# Patient Record
Sex: Male | Born: 1990 | Race: White | Hispanic: No | Marital: Married | Smoking: Never smoker
Health system: Southern US, Community
[De-identification: ages and names within clinical notes are randomized; demographics above are authoritative.]

## PROBLEM LIST (undated history)

## (undated) DIAGNOSIS — J45909 Unspecified asthma, uncomplicated: Secondary | ICD-10-CM

## (undated) HISTORY — PX: DENTAL SURGERY: SHX609

---

## 2012-06-21 ENCOUNTER — Emergency Department (INDEPENDENT_AMBULATORY_CARE_PROVIDER_SITE_OTHER): Payer: BC Managed Care – PPO

## 2012-06-21 ENCOUNTER — Emergency Department (INDEPENDENT_AMBULATORY_CARE_PROVIDER_SITE_OTHER)
Admission: EM | Admit: 2012-06-21 | Discharge: 2012-06-21 | Disposition: A | Discharge status: Home / Self Care | Payer: BC Managed Care – PPO | Source: Home / Self Care | Attending: Family Medicine | Admitting: Family Medicine

## 2012-06-21 ENCOUNTER — Encounter: Payer: Self-pay | Admitting: Emergency Medicine

## 2012-06-21 DIAGNOSIS — R05 Cough: Secondary | ICD-10-CM

## 2012-06-21 DIAGNOSIS — J101 Influenza due to other identified influenza virus with other respiratory manifestations: Secondary | ICD-10-CM

## 2012-06-21 DIAGNOSIS — R059 Cough, unspecified: Secondary | ICD-10-CM

## 2012-06-21 DIAGNOSIS — J111 Influenza due to unidentified influenza virus with other respiratory manifestations: Secondary | ICD-10-CM

## 2012-06-21 HISTORY — DX: Unspecified asthma, uncomplicated: J45.909

## 2012-06-21 MED ORDER — AZITHROMYCIN 250 MG PO TABS: ORAL_TABLET | ORAL | Status: DC

## 2012-06-21 MED ORDER — GUAIFENESIN-CODEINE 100-10 MG/5ML PO SYRP: 10.0000 mL | ORAL_SOLUTION | Freq: Every day | ORAL | Status: DC

## 2012-06-21 MED ORDER — PREDNISONE 20 MG PO TABS: 20.0000 mg | ORAL_TABLET | Freq: Two times a day (BID) | ORAL | Status: DC

## 2012-06-21 NOTE — ED Provider Notes (Signed)
History     CSN: [id number]  Arrival date & time 06/21/12  1556   First MD Initiated Contact with Patient 06/21/12 1618      Chief Complaint  Patient presents with  . Fever  . Nasal Congestion  . Cough  . Generalized Body Aches  . Dizziness     HPI Comments: Patient developed cough and low grade fever about 6 days ago, and two days later developed increasing fever, myalgias, and fatigue.  His symptoms waxed and waned, and two days ago he presented to [facility] where a flu test was positive for influenza A.  He continues to feel fatigued with recurring shortness of breath with activity but no wheezes.  He has a history of environmentally induced asthma.  He does not use an albuterol inhaler.  He does not smoke.  The history is provided by the patient and a friend.    Past Medical History  Diagnosis Date  . Asthma     History reviewed. No pertinent past surgical history.  Family History  Problem Relation Age of Onset  . Hypertension Maternal Uncle     History  Substance Use Topics  . Smoking status: Never Smoker   . Smokeless tobacco: Not on file  . Alcohol Use: No      Review of Systems No sore throat + cough No pleuritic pain but feels tight in anterior chest ? wheezing No nasal congestion No post-nasal drainage No sinus pain/pressure No itchy/red eyes No earache No hemoptysis + SOB + fever, + chills No nausea No vomiting No abdominal pain No diarrhea No urinary symptoms No skin rashes + fatigue + myalgias + headache Used OTC meds without relief  Allergies  Peanuts  Home Medications   Current Outpatient Rx  Name  Route  Sig  Dispense  Refill  . AZITHROMYCIN 250 MG PO TABS      Take 2 tabs today; then begin one tab once daily for 4 more days. (Rx void after 06/29/12)   6 each   0   . GUAIFENESIN-CODEINE 100-10 MG/5ML PO SYRP   Oral   Take 10 mLs by mouth at bedtime. for cough   120 mL   0   . PREDNISONE 20 MG PO TABS   Oral   Take  1 tablet (20 mg total) by mouth 2 (two) times daily.   10 tablet   0     BP 122/78  Pulse 102  Temp 98.8 F (37.1 C) (Oral)  Resp 16  Ht 5\' 6"  (1.676 m)  Wt 165 lb (74.844 kg)  BMI 26.63 kg/m2  SpO2 97%  Physical Exam Nursing notes and Vital Signs reviewed. Appearance:  Patient appears healthy, stated age, and in no acute distress Eyes:  Pupils are equal, round, and reactive to light and accomodation.  Extraocular movement is intact.  Conjunctivae are not inflamed  Ears:  Canals normal.  Tympanic membranes normal.  Nose:  Mildly congested turbinates.  No sinus tenderness  Pharynx:  Normal Neck:  Supple.  No adenopathy Lungs:  Clear to auscultation.  Breath sounds are equal.  Heart:  Regular rate and rhythm without murmurs, rubs, or gallops.  Abdomen:  Nontender without masses or hepatosplenomegaly.  Bowel sounds are present.  No CVA or flank tenderness.  Extremities:  No edema.  No calf tenderness Skin:  No rash present.   ED Course  Procedures  none  Labs Reviewed - No data to display Dg Chest 2 View  06/21/2012  *  RADIOLOGY REPORT*  Clinical Data: 6 days of coughing.  CHEST - 2 VIEW  Comparison: None.  Findings: Two views of the chest demonstrate clear lung.   Heart and mediastinum are within normal limits.  The trachea is midline. Bony thorax is intact.  IMPRESSION: No acute cardiopulmonary disease.   Original Report Authenticated By: Richarda Overlie, M.D.      1. Influenza A   2. Persistent cough       MDM  Begin prednisone burst.  Tussi-Organidin at bedtime for cough. Take plain Mucinex (guaifenesin) twice daily for cough and congestion.  Increase fluid intake, rest. Stop all antihistamines for now, and other non-prescription cough/cold preparations. Begin Azithromycin if not improving about 5 days or if persistent fever develops. Recommend a flu shot when well.  Follow-up with family doctor if not improving about one week.        Lattie Haw, MD 06/25/12  [phone]

## 2012-06-21 NOTE — ED Notes (Signed)
Reports 6 days of congestion, cough, intermittent fever, body and headache. No Flu Vaccination this season. No recent OTCs.

## 2012-11-20 ENCOUNTER — Encounter: Payer: Self-pay | Admitting: Family Medicine

## 2012-11-20 ENCOUNTER — Ambulatory Visit (INDEPENDENT_AMBULATORY_CARE_PROVIDER_SITE_OTHER): Payer: BC Managed Care – PPO | Admitting: Family Medicine

## 2012-11-20 VITALS — BP 146/80 | HR 61 | Ht 67.0 in | Wt 162.0 lb

## 2012-11-20 DIAGNOSIS — R1013 Epigastric pain: Secondary | ICD-10-CM | POA: Insufficient documentation

## 2012-11-20 LAB — COMPLETE METABOLIC PANEL WITH GFR
ALT: 30 U/L (ref 0–53)
AST: 25 U/L (ref 0–37)
Albumin: 4.8 g/dL (ref 3.5–5.2)
Alkaline Phosphatase: 50 U/L (ref 39–117)
Glucose, Bld: 101 mg/dL — ABNORMAL HIGH (ref 70–99)
Potassium: 4.2 mEq/L (ref 3.5–5.3)
Sodium: 139 mEq/L (ref 135–145)
Total Protein: 7.4 g/dL (ref 6.0–8.3)

## 2012-11-20 MED ORDER — OMEPRAZOLE 40 MG PO CPDR: 40.0000 mg | DELAYED_RELEASE_CAPSULE | Freq: Every day | ORAL | Status: DC

## 2012-11-20 NOTE — Progress Notes (Signed)
CC: Ross Wagner is a 22 y.o. male is here for Establish Care and digestive issues?   Subjective: HPI:  Patient complains of epigastric pain    that is described as a spasm and cramping any sensation of an empty stomach that is of moderate severity that seems to be worse with raw bananas, lettuce, raw vegetables, but can occur anytime of the day he regardless of dietary habits. It is somewhat radiating into the right upper quadrant sometimes into the back to a mild degree. Significantly worsened the last 2 days has been present for about a month overall.  Symptoms significantly improved with Aloe vera liquid concoction.  He reports rare nonsteroidal anti-inflammatory use. He denies alcohol use or tobacco use. No recreational drug use. He denies nausea, vomiting, decreased appetite, constipation nor diarrhea. Frequent has sensation of solids and liquids slowly passing down the esophagus when this pain above is present  Review of Systems - General ROS: negative for - chills, fever, night sweats, weight gain or weight loss Ophthalmic ROS: negative for - decreased vision Psychological ROS: negative for - anxiety or depression ENT ROS: negative for - hearing change, nasal congestion, tinnitus or allergies Hematological and Lymphatic ROS: negative for - bleeding problems, bruising or swollen lymph nodes Breast ROS: negative Respiratory ROS: no cough, shortness of breath, or wheezing Cardiovascular ROS: no chest pain or dyspnea on exertion Gastrointestinal ROS: no change in bowel habits, or black or bloody stools Genito-Urinary ROS: negative for - genital discharge, genital ulcers, incontinence or abnormal bleeding from genitals Musculoskeletal ROS: negative for - joint pain or muscle pain Neurological ROS: negative for - headaches or memory loss Dermatological ROS: negative for lumps, mole changes, rash and skin lesion changes  Past Medical History  Diagnosis Date  . Asthma      Family  History  Problem Relation Age of Onset  . Hypertension Maternal Uncle   . Stroke Maternal Grandmother      History  Substance Use Topics  . Smoking status: Never Smoker   . Smokeless tobacco: Not on file  . Alcohol Use: No     Objective: Filed Vitals:   11/20/12 1037  BP: 146/80  Pulse: 61    General: Alert and Oriented, No Acute Distress HEENT: Pupils equal, round, reactive to light. Conjunctivae clear.  Moist mucous membranes pharynx unremarkable Lungs: Clear to auscultation bilaterally, no wheezing/ronchi/rales.  Comfortable work of breathing. Good air movement. Cardiac: Regular rate and rhythm. Normal S1/S2.  No murmurs, rubs, nor gallops.   Abdomen: Normal bowel sounds, soft and non tender without palpable masses. Extremities: No peripheral edema.  Strong peripheral pulses.  Mental Status: No depression, anxiety, nor agitation. Skin: Warm and dry.  Assessment & Plan: Ross Wagner was seen today for establish care and digestive issues?.  Diagnoses and associated orders for this visit:  Abdominal pain, epigastric - omeprazole (PRILOSEC) 40 MG capsule; Take 1 capsule (40 mg total) by mouth daily. - COMPLETE METABOLIC PANEL WITH GFR - H. pylori antibody, IgG    Discussed with patient my suspicion for gastritis versus cholecystitis, low suspicion for pancreatitis. Start proton pump inhibitor, given samples of Nexium for 10 days if improved start omeprazole. Labs above  Return in about 4 weeks (around 12/18/2012).

## 2013-04-26 ENCOUNTER — Ambulatory Visit (INDEPENDENT_AMBULATORY_CARE_PROVIDER_SITE_OTHER): Payer: BC Managed Care – PPO | Admitting: Family Medicine

## 2013-04-26 ENCOUNTER — Encounter: Payer: Self-pay | Admitting: Family Medicine

## 2013-04-26 VITALS — BP 136/86 | HR 69 | Wt 160.0 lb

## 2013-04-26 DIAGNOSIS — K644 Residual hemorrhoidal skin tags: Secondary | ICD-10-CM

## 2013-04-26 MED ORDER — HYDROCORTISONE 2.5 % RE CREA: TOPICAL_CREAM | RECTAL | Status: AC

## 2013-04-26 NOTE — Progress Notes (Signed)
CC: Ross Wagner is a 22 y.o. male is here for Hemorrhoids   Subjective: HPI:  Rectal discomfort that has been present since Thursday initially described as 8 irritation and swelling worse when sitting down slightly improved since starting Preparation H twice a day over the weekend. He's never had this before. 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  He denies recent constipation, blood in stool, melena, abdominal pain, painful defecation.   Review Of Systems Outlined In HPI  Past Medical History  Diagnosis Date  . Asthma      Family History  Problem Relation Age of Onset  . Hypertension Maternal Uncle   . Stroke Maternal Grandmother      History  Substance Use Topics  . Smoking status: Never Smoker   . Smokeless tobacco: Not on file  . Alcohol Use: No     Objective: Filed Vitals:   04/26/13 1458  BP: 136/86  Pulse: 69    Vital signs reviewed. General: Alert and Oriented, No Acute Distress HEENT: Pupils equal, round, reactive to light. Conjunctivae clear.  External ears unremarkable.  Moist mucous membranes. Lungs: Clear and comfortable work of breathing, speaking in full sentences without accessory muscle use. Cardiac: Regular rate and rhythm.  Rectal: Single slightly inflamed external hemorrhoid easily reducible, approximately 1 cm diameter Mental Status: No depression, anxiety, nor agitation. Logical though process. Skin: Warm and dry.  Assessment & Plan: [name] was seen today for hemorrhoids.  Diagnoses and associated orders for this visit:  External hemorrhoid - hydrocortisone (ANUSOL-HC) 2.5 % rectal cream; Apply rectally 2 times daily    External hemorrhoid: Continue Preparation H may use hydrocortisone for any irritation or discomfort, focus on easy to pass painless bowel movements daily. Discussed signs and symptoms of thrombosed hemorrhoid that would require surgical intervention. Anticipate continued self  resolution in one week.  Return if symptoms worsen or fail to improve.

## 2013-06-30 ENCOUNTER — Ambulatory Visit (INDEPENDENT_AMBULATORY_CARE_PROVIDER_SITE_OTHER): Payer: BC Managed Care – PPO | Admitting: Family Medicine

## 2013-06-30 ENCOUNTER — Encounter: Payer: Self-pay | Admitting: Family Medicine

## 2013-06-30 VITALS — BP 145/80 | HR 73 | Temp 98.1°F | Wt 161.0 lb

## 2013-06-30 DIAGNOSIS — R3915 Urgency of urination: Secondary | ICD-10-CM

## 2013-06-30 LAB — POCT URINALYSIS DIPSTICK
Bilirubin, UA: NEGATIVE
Glucose, UA: NEGATIVE
Ketones, UA: NEGATIVE
LEUKOCYTES UA: NEGATIVE
NITRITE UA: NEGATIVE
PH UA: 6.5
PROTEIN UA: NEGATIVE
Spec Grav, UA: 1.01
UROBILINOGEN UA: 0.2

## 2013-06-30 MED ORDER — CIPROFLOXACIN HCL 500 MG PO TABS: 500.0000 mg | ORAL_TABLET | Freq: Two times a day (BID) | ORAL | Status: DC

## 2013-06-30 NOTE — Progress Notes (Signed)
CC: Ross Wagner is a 23 y.o. male is here for urinary urgency?   Subjective: HPI:  Complains of urinary urgency and frequency of mild-to-moderate severity it has been present since Monday. Occur all hours of the day. Nothing particularly makes better or worse. Over the past 12 hours has been accompanied by a mild dull sensation in the left testicle but comes and goes throughout the day. No interventions as of yet. He had questionable flank pain on the right for matter of hours on Monday but this is not past. He denies change in the odor color or consistency of his urine. Denies fevers, chills, penile discharge. Has been in a monogamous relationship for over 2 years no new sexual partners   Review Of Systems Outlined In HPI  Past Medical History  Diagnosis Date  . Asthma      Family History  Problem Relation Age of Onset  . Hypertension Maternal Uncle   . Stroke Maternal Grandmother      History  Substance Use Topics  . Smoking status: Never Smoker   . Smokeless tobacco: Not on file  . Alcohol Use: No     Objective: Filed Vitals:   06/30/13 1545  BP: 145/80  Pulse: 73  Temp: 98.1 F (36.7 C)    Vital signs reviewed. General: Alert and Oriented, No Acute Distress HEENT: Pupils equal, round, reactive to light. Conjunctivae clear.  External ears unremarkable.  Moist mucous membranes. Lungs: Clear and comfortable work of breathing, speaking in full sentences without accessory muscle use. Cardiac: Regular rate and rhythm.  N back: No CVA tenderness Extremities: No peripheral edema.  Strong peripheral pulses.  Mental Status: No depression, anxiety, nor agitation. Logical though process. Skin: Warm and dry.  Assessment & Plan: Ross Wagner was seen today for urinary urgency?.  Diagnoses and associated orders for this visit:  Urinary urgency - Urinalysis Dipstick - Urine Culture - GC/chlamydia probe amp, urine - ciprofloxacin (CIPRO) 500 MG tablet; Take 1 tablet (500  mg total) by mouth 2 (two) times daily.    Urinary urgency: No glucose in urine however blood is present, low suspicion for kidney stones next is suspicious for urinary tract infection, doubt for gonorrhea and Chlamydia however will ensure he is not harboring STI, start Cipro and we will follow culture  Return if symptoms worsen or fail to improve.

## 2013-07-01 ENCOUNTER — Telehealth: Payer: Self-pay | Admitting: *Deleted

## 2013-07-01 LAB — GC/CHLAMYDIA PROBE AMP, URINE
Chlamydia, Swab/Urine, PCR: NEGATIVE
GC PROBE AMP, URINE: NEGATIVE

## 2013-07-01 NOTE — Telephone Encounter (Signed)
Pt calls and states wife had a urine sample done last week at [facility] and was positive for Group B strep bacteria. He states he seen you yesterday and wanted to let you know this. Barry DienesKimberly Gordon, LPN

## 2013-07-02 ENCOUNTER — Telehealth: Payer: Self-pay | Admitting: *Deleted

## 2013-07-02 LAB — URINE CULTURE: Colony Count: 9000

## 2013-07-02 MED ORDER — SULFAMETHOXAZOLE-TRIMETHOPRIM 800-160 MG PO TABS: ORAL_TABLET | ORAL | Status: DC

## 2013-07-02 NOTE — Telephone Encounter (Signed)
Sue Lushndrea, Will you please let [name] know that if his symptoms are not resolved by tomorrow then I'd encourage him to switch antiboitics to bactrim which I've already sent to [facility] since it'll be the weekend.

## 2013-07-02 NOTE — Telephone Encounter (Signed)
Pt calls triage line back and states that his symptoms cone and go in waves.  Yesterday he felt ok but today he is having some urgency, nausea, discomfort testicle area and some low back pain

## 2013-07-02 NOTE — Telephone Encounter (Signed)
Pt notified. I told him if he didn't notice any improvement after starting the abx over the weekend then to call us back on mon.Pt voiced understanding

## 2013-07-05 ENCOUNTER — Ambulatory Visit (HOSPITAL_BASED_OUTPATIENT_CLINIC_OR_DEPARTMENT_OTHER)
Admission: RE | Admit: 2013-07-05 | Discharge: 2013-07-05 | Disposition: A | Discharge status: Home / Self Care | Payer: BC Managed Care – PPO | Source: Ambulatory Visit | Attending: Family Medicine | Admitting: Family Medicine

## 2013-07-05 ENCOUNTER — Other Ambulatory Visit: Payer: Self-pay | Admitting: Family Medicine

## 2013-07-05 ENCOUNTER — Ambulatory Visit (INDEPENDENT_AMBULATORY_CARE_PROVIDER_SITE_OTHER): Payer: BC Managed Care – PPO | Admitting: Family Medicine

## 2013-07-05 ENCOUNTER — Encounter: Payer: Self-pay | Admitting: Family Medicine

## 2013-07-05 ENCOUNTER — Telehealth: Payer: Self-pay | Admitting: Family Medicine

## 2013-07-05 VITALS — BP 146/84 | HR 74 | Temp 97.8°F | Wt 157.0 lb

## 2013-07-05 DIAGNOSIS — N509 Disorder of male genital organs, unspecified: Secondary | ICD-10-CM

## 2013-07-05 DIAGNOSIS — N50812 Left testicular pain: Secondary | ICD-10-CM

## 2013-07-05 DIAGNOSIS — N200 Calculus of kidney: Secondary | ICD-10-CM

## 2013-07-05 LAB — POCT URINALYSIS DIPSTICK
BILIRUBIN UA: NEGATIVE
GLUCOSE UA: NEGATIVE
KETONES UA: NEGATIVE
Leukocytes, UA: NEGATIVE
NITRITE UA: NEGATIVE
Protein, UA: NEGATIVE
SPEC GRAV UA: 1.01
Urobilinogen, UA: 0.2
pH, UA: 6

## 2013-07-05 MED ORDER — TAMSULOSIN HCL 0.4 MG PO CAPS: 0.4000 mg | ORAL_CAPSULE | Freq: Every day | ORAL | Status: DC

## 2013-07-05 NOTE — Progress Notes (Signed)
CC: Ross Wagner is a [age] y.o. male is here for side effects from medication   Subjective: HPI:  Complains of continued left groin pain that radiates into the left testicle it comes in waves mild in severity at its maximum. It is worse when lying on his side with his legs together, symptoms are improved with opening his legs. Symptoms are also worse with sitting for long periods of time. Denies dysuria but still endorses mild urinary frequency. Symptoms have not gotten better or worse for the most part since being on Cipro and then later changing to Bactrim. Denies dysuria or penile discharge. Denies testicular swelling or overlying skin changes. This is been accompanied by nausea that is unchanging overall since earlier this week. Reports feeling waves of fatigue and mild shortness of breath ever since starting Bactrim on Saturday he has stopped taking this medication as of yesterday morning.  Denies fevers, chills, night sweats, abdominal pain, vomiting, nor flank pain.  Review Of Systems Outlined In HPI  Past Medical History  Diagnosis Date  . Asthma      Family History  Problem Relation Age of Onset  . Hypertension Maternal Uncle   . Stroke Maternal Grandmother      History  Substance Use Topics  . Smoking status: Never Smoker   . Smokeless tobacco: Not on file  . Alcohol Use: No     Objective: Filed Vitals:   07/05/13 1306  BP: 146/84  Pulse: 74  Temp: 97.8 F (36.6 C)    General: Alert and Oriented, No Acute Distress HEENT: Pupils equal, round, reactive to light. Conjunctivae clear.  Moist mucous membranes Lungs: Clear to auscultation bilaterally, no wheezing/ronchi/rales.  Comfortable work of breathing. Good air movement. Cardiac: Regular rate and rhythm. Normal S1/S2.  No murmurs, rubs, nor gallops.   Genitourinary: Unremarkable penis without lesions or discharge. Left testicle is slightly tender at the epididymis there is no swelling or palpable masses in the  testicle. Mental Status: No depression, anxiety, nor agitation. Skin: Warm and dry.  Assessment & Plan: [name] was seen today for side effects from medication.  Diagnoses and associated orders for this visit:  Testicular pain, left - Urinalysis Dipstick - Urine Culture - US Scrotum; Future    Left testicular pain: Blood remains in his urinalysis, since I can reproduce the pain when inspecting the testicle nephrolithiasis is lower on the differential. Will check ultrasound of the scrotum due to suspicion of epididymitis.  Fortunately gonorrhea and Chlamydia have been negative and urine culture failed to speciate last week. We will repeat urine culture. Stop Bactrim for now  Return if symptoms worsen or fail to improve.

## 2013-07-05 NOTE — Telephone Encounter (Signed)
Ross Wagner, [name] please let patient know that his scrotal ultrasound did not show any sign of inflammation in the testicles, this leads me to conclude that his discomfort and nausea are caused by a small kidney stone therefore start tamsulosin which [facility] and call us with an update on his symptoms later this week.  This medication is only necessary for a few weeks or if he has immediate resolution of his symptoms which would reflect that he passed a kidney stone.

## 2013-07-05 NOTE — Telephone Encounter (Signed)
Pt notified and voiced understanding. He also asked if he should continue to monitor his SOB. I told him to continue to monitor this and if it gets worse to call us back and or if he ever has SOB to the point that he is having extreme difficulty breathing then of course go to the ER but I asked Dr. Ivan AnchorsHommel and he feels the SOB is a side effect of the medication and should  eventually resolve

## 2013-07-06 LAB — URINE CULTURE
Colony Count: NO GROWTH
Organism ID, Bacteria: NO GROWTH

## 2015-04-12 ENCOUNTER — Ambulatory Visit (INDEPENDENT_AMBULATORY_CARE_PROVIDER_SITE_OTHER): Payer: PRIVATE HEALTH INSURANCE | Admitting: Family Medicine

## 2015-04-12 ENCOUNTER — Encounter: Payer: Self-pay | Admitting: Family Medicine

## 2015-04-12 VITALS — BP 141/85 | HR 62 | Wt 161.0 lb

## 2015-04-12 DIAGNOSIS — J4521 Mild intermittent asthma with (acute) exacerbation: Secondary | ICD-10-CM

## 2015-04-12 MED ORDER — ALBUTEROL SULFATE 108 (90 BASE) MCG/ACT IN AEPB: 1.0000 | INHALATION_SPRAY | Freq: Four times a day (QID) | RESPIRATORY_TRACT | Status: DC | PRN

## 2015-04-12 MED ORDER — PREDNISONE 20 MG PO TABS: ORAL_TABLET | ORAL | Status: DC

## 2015-04-12 NOTE — Progress Notes (Signed)
CC: Ross Wagner is a 24 y.o. male is here for Cough and Wheezing   Subjective: HPI:  Cough, wheezing, chest tightness that has been going on for the last 2 nights. It only happens when he is trying to go to bed. It does not wake him up at night. Nothing seems to make symptoms better or worse that he is aware of. He tells me this has happened around this time of year for the past few years always occurring once he's getting over a comment of her respiratory illness. He had some facial pressure and nasal congestion last week but it's almost resolved now. He denies any exertional chest pain or exertional shortness of breath. He had asthma as a child. No interventions as of yet. He denies fevers, chills, orthopnea nor peripheral edema   Review Of Systems Outlined In HPI  Past Medical History  Diagnosis Date  . Asthma     No past surgical history on file. Family History  Problem Relation Age of Onset  . Hypertension Maternal Uncle   . Stroke Maternal Grandmother     Social History   Social History  . Marital Status: Married    Spouse Name: N/A  . Number of Children: N/A  . Years of Education: N/A   Occupational History  . Not on file.   Social History Main Topics  . Smoking status: Never Smoker   . Smokeless tobacco: Not on file  . Alcohol Use: No  . Drug Use: No  . Sexual Activity: Yes   Other Topics Concern  . Not on file   Social History Narrative     Objective: BP 141/85 mmHg  Pulse 62  Wt 161 lb (73.029 kg)  General: Alert and Oriented, No Acute Distress HEENT: Pupils equal, round, reactive to light. Conjunctivae clear.  External ears unremarkable, canals clear with intact TMs with appropriate landmarks.  Middle ear appears open without effusion. Pink inferior turbinates.  Moist mucous membranes, pharynx without inflammation nor lesions.  Neck supple without palpable lymphadenopathy nor abnormal masses. Lungs: comfortable work of breathing with mild wheezing  in the lower lung fields, no rhonchi or rales. Cardiac: Regular rate and rhythm. Normal S1/S2.  No murmurs, rubs, nor gallops.    Mental Status: No depression, anxiety, nor agitation. Skin: Warm and dry.  Assessment & Plan: [name] was seen today for cough and wheezing.  Diagnoses and all orders for this visit:  Reactive airway disease, mild intermittent, with acute exacerbation  Other orders -     predniSONE (DELTASONE) 20 MG tablet; Three tabs at once daily for five days. -     Albuterol Sulfate (PROAIR RESPICLICK) 108 (90 BASE) MCG/ACT AEPB; Inhale 1-2 puffs into the lungs every 6 (six) hours as needed (cough, wheezing, shortness of breath).   Reactive airway disease: Start as needed albuterol, start prednisone immediately.Signs and symptoms requring emergent/urgent reevaluation were discussed with the patient. He has follow-up on Monday.  Return if symptoms worsen or fail to improve.

## 2015-04-17 ENCOUNTER — Ambulatory Visit (INDEPENDENT_AMBULATORY_CARE_PROVIDER_SITE_OTHER): Payer: PRIVATE HEALTH INSURANCE | Admitting: Family Medicine

## 2015-04-17 ENCOUNTER — Encounter: Payer: Self-pay | Admitting: Family Medicine

## 2015-04-17 VITALS — BP 131/77 | HR 67 | Wt 160.0 lb

## 2015-04-17 DIAGNOSIS — J45909 Unspecified asthma, uncomplicated: Secondary | ICD-10-CM | POA: Insufficient documentation

## 2015-04-17 DIAGNOSIS — Z0000 Encounter for general adult medical examination without abnormal findings: Secondary | ICD-10-CM

## 2015-04-17 DIAGNOSIS — Z23 Encounter for immunization: Secondary | ICD-10-CM | POA: Diagnosis not present

## 2015-04-17 DIAGNOSIS — J4520 Mild intermittent asthma, uncomplicated: Secondary | ICD-10-CM

## 2015-04-17 NOTE — Progress Notes (Signed)
CC: Ross Wagner is a 24 y.o. male is here for Annual Exam and Center Back Pain   Subjective: HPI:  Colonoscopy: No current indication Prostate: Discussed screening risks/beneifts with patient today, no current indication  Influenza Vaccine: declined Pneumovax: No current indication Td/Tdap: Overdue for booster and will receive Tdap today Zoster: (Start 26 yo)  Requesting complete physical exam with his only complaint being pain between the shoulder blades if he does not keep his lumbar spine and a lordotic position while sitting. It will go away immediately once he corrects his posture.   Review of Systems - General ROS: negative for - chills, fever, night sweats, weight gain or weight loss Ophthalmic ROS: negative for - decreased vision Psychological ROS: negative for - anxiety or depression ENT ROS: negative for - hearing change, nasal congestion, tinnitus or allergies Hematological and Lymphatic ROS: negative for - bleeding problems, bruising or swollen lymph nodes Breast ROS: negative Respiratory ROS: no cough, shortness of breath, or wheezing Cardiovascular ROS: no chest pain or dyspnea on exertion Gastrointestinal ROS: no abdominal pain, change in bowel habits, or black or bloody stools Genito-Urinary ROS: negative for - genital discharge, genital ulcers, incontinence or abnormal bleeding from genitals Musculoskeletal ROS: negative for - joint pain or muscle pain other than that described above Neurological ROS: negative for - headaches or memory loss Dermatological ROS: negative for lumps, mole changes, rash and skin lesion changes  Past Medical History  Diagnosis Date  . Asthma     No past surgical history on file. Family History  Problem Relation Age of Onset  . Hypertension Maternal Uncle   . Stroke Maternal Grandmother     Social History   Social History  . Marital Status: Married    Spouse Name: N/A  . Number of Children: N/A  . Years of Education: N/A    Occupational History  . Not on file.   Social History Main Topics  . Smoking status: Never Smoker   . Smokeless tobacco: Not on file  . Alcohol Use: No  . Drug Use: No  . Sexual Activity: Yes   Other Topics Concern  . Not on file   Social History Narrative     Objective: BP 131/77 mmHg  Pulse 67  Wt 160 lb (72.576 kg)  General: No Acute Distress HEENT: Atraumatic, normocephalic, conjunctivae normal without scleral icterus.  No nasal discharge, hearing grossly intact, TMs with good landmarks bilaterally with no middle ear abnormalities, posterior pharynx clear without oral lesions. Neck: Supple, trachea midline, no cervical nor supraclavicular adenopathy. Pulmonary: Clear to auscultation bilaterally without wheezing, rhonchi, nor rales. Cardiac: Regular rate and rhythm.  No murmurs, rubs, nor gallops. No peripheral edema.  2+ peripheral pulses bilaterally. Abdomen: Bowel sounds normal.  No masses.  Non-tender without rebound.  Negative Murphy's sign. MSK: Grossly intact, no signs of weakness.  Full strength throughout upper and lower extremities.  Full ROM in upper and lower extremities.  No midline spinal tenderness. Neuro: Gait unremarkable, CN II-XII grossly intact.  C5-C6 Reflex 2/4 Bilaterally, L4 Reflex 2/4 Bilaterally.  Cerebellar function intact. Skin: No rashes. Psych: Alert and oriented to person/place/time.  Thought process normal. No anxiety/depression.  Assessment & Plan: Ross Wagner was seen today for annual exam and center back pain.  Diagnoses and all orders for this visit:  Annual physical exam -     Lipid panel -     COMPLETE METABOLIC PANEL WITH GFR -     CBC  Reactive airway disease, mild  intermittent, uncomplicated  Need for Tdap vaccination -     Tdap vaccine greater than or equal to 7yo IM   Healthy lifestyle interventions including but not limited to regular exercise, a healthy low fat diet, moderation of salt intake, the dangers of  tobacco/alcohol/recreational drug use, nutrition supplementation, and accident avoidance were discussed with the patient and a handout was provided for future reference.  Reactive airway disease exacerbation has now resolved  Return in about 1 year (around 04/16/2016) for annual physical.

## 2015-04-17 NOTE — Patient Instructions (Signed)
Dr. Martin Smeal's General Advice Following Your Complete Physical Exam  The Benefits of Regular Exercise: Unless you suffer from an uncontrolled cardiovascular condition, studies strongly suggest that regular exercise and physical activity will add to both the quality and length of your life.  The World Health Organization recommends 150 minutes of moderate intensity aerobic activity every week.  This is best split over 3-4 days a week, and can be as simple as a brisk walk for just over 35 minutes "most days of the week".  This type of exercise has been shown to lower LDL-Cholesterol, lower average blood sugars, lower blood pressure, lower cardiovascular disease risk, improve memory, and increase one's overall sense of wellbeing.  The addition of anaerobic (or "strength training") exercises offers additional benefits including but not limited to increased metabolism, prevention of osteoporosis, and improved overall cholesterol levels.  How Can I Strive For A Low-Fat Diet?: Current guidelines recommend that 25-35 percent of your daily energy (food) intake should come from fats.  One might ask how can this be achieved without having to dissect each meal on a daily basis?  Switch to skim or 1% milk instead of whole milk.  Focus on lean meats such as ground turkey, fresh fish, baked chicken, and lean cuts of beef as your source of dietary protein.  Limit saturated fat consumption to less than 10% of your daily caloric intake.  Limit trans fatty acid consumption primarily by limiting synthetic trans fats such as partially hydrogenated oils (Ex: fried fast foods).  Substitute olive or vegetable oil for solid fats where possible.  Moderation of Salt Intake: Provided you don't carry a diagnosis of congestive heart failure nor renal failure, I recommend a daily allowance of no more than 2300 mg of salt (sodium).  Keeping under this daily goal is associated with a decreased risk of cardiovascular events, creeping  above it can lead to elevated blood pressures and increases your risk of cardiovascular events.  Milligrams (mg) of salt is listed on all nutrition labels, and your daily intake can add up faster than you think.  Most canned and frozen dinners can pack in over half your daily salt allowance in one meal.    Lifestyle Health Risks: Certain lifestyle choices carry specific health risks.  As you may already know, tobacco use has been associated with increasing one's risk of cardiovascular disease, pulmonary disease, numerous cancers, among many other issues.  What you may not know is that there are medications and nicotine replacement strategies that can more than double your chances of successfully quitting.  I would be thrilled to help manage your quitting strategy if you currently use tobacco products.  When it comes to alcohol use, I've yet to find an "ideal" daily allowance.  Provided an individual does not have a medical condition that is exacerbated by alcohol consumption, general guidelines determine "safe drinking" as no more than two standard drinks for a man or no more than one standard drink for a male per day.  However, much debate still exists on whether any amount of alcohol consumption is technically "safe".  My general advice, keep alcohol consumption to a minimum for general health promotion.  If you or others believe that alcohol, tobacco, or recreational drug use is interfering with your life, I would be happy to provide confidential counseling regarding treatment options.  General "Over The Counter" Nutrition Advice: Postmenopausal women should aim for a daily calcium intake of 1200 mg, however a significant portion of this might already be   provided by diets including milk, yogurt, cheese, and other dairy products.  Vitamin D has been shown to help preserve bone density, prevent fatigue, and has even been shown to help reduce falls in the elderly.  Ensuring a daily intake of 800 Units of  Vitamin D is a good place to start to enjoy the above benefits, we can easily check your Vitamin D level to see if you'd potentially benefit from supplementation beyond 800 Units a day.  Folic Acid intake should be of particular concern to women of childbearing age.  Daily consumption of 400-800 mcg of Folic Acid is recommended to minimize the chance of spinal cord defects in a fetus should pregnancy occur.    For many adults, accidents still remain one of the most common culprits when it comes to cause of death.  Some of the simplest but most effective preventitive habits you can adopt include regular seatbelt use, proper helmet use, securing firearms, and regularly testing your smoke and carbon monoxide detectors.  Jerimie Mancuso B. Claressa Hughley DO [facility] [address], [address] [place], [place] [address] Phone: [phone]  

## 2015-05-06 ENCOUNTER — Encounter: Payer: Self-pay | Admitting: Emergency Medicine

## 2015-05-06 ENCOUNTER — Emergency Department (INDEPENDENT_AMBULATORY_CARE_PROVIDER_SITE_OTHER)
Admission: EM | Admit: 2015-05-06 | Discharge: 2015-05-06 | Disposition: A | Discharge status: Home / Self Care | Payer: PRIVATE HEALTH INSURANCE | Source: Home / Self Care | Attending: Family Medicine | Admitting: Family Medicine

## 2015-05-06 ENCOUNTER — Emergency Department (INDEPENDENT_AMBULATORY_CARE_PROVIDER_SITE_OTHER): Payer: PRIVATE HEALTH INSURANCE

## 2015-05-06 DIAGNOSIS — R0781 Pleurodynia: Secondary | ICD-10-CM | POA: Diagnosis not present

## 2015-05-06 DIAGNOSIS — J069 Acute upper respiratory infection, unspecified: Secondary | ICD-10-CM | POA: Diagnosis not present

## 2015-05-06 DIAGNOSIS — R05 Cough: Secondary | ICD-10-CM

## 2015-05-06 DIAGNOSIS — R0789 Other chest pain: Secondary | ICD-10-CM | POA: Diagnosis not present

## 2015-05-06 DIAGNOSIS — R059 Cough, unspecified: Secondary | ICD-10-CM

## 2015-05-06 MED ORDER — AZITHROMYCIN 250 MG PO TABS: 250.0000 mg | ORAL_TABLET | Freq: Every day | ORAL | Status: DC

## 2015-05-06 NOTE — ED Notes (Signed)
Pt c/o right sided rib pain. He has had a cough x1 week. States rib area is sensitive to touch. Denies N+V or fever.

## 2015-05-06 NOTE — Discharge Instructions (Signed)
You may take 400-600mg  Ibuprofen (Motrin) every 6-8 hours for fever and pain  Alternate with Tylenol  You may take 500mg  Tylenol every 4-6 hours as needed for fever and pain  Follow-up with your primary care provider next week for recheck of symptoms if not improving.  Be sure to drink plenty of fluids and rest, at least 8hrs of sleep a night, preferably more while you are sick. Return urgent care or go to closest ER if you cannot keep down fluids/signs of dehydration, fever not reducing with Tylenol, difficulty breathing/wheezing, stiff neck, worsening condition, or other concerns (see below)  Please take antibiotics as prescribed and be sure to complete entire course even if you start to feel better to ensure infection does not come back.   Chest Wall Pain Chest wall pain is pain in or around the bones and muscles of your chest. Sometimes, an injury causes this pain. Sometimes, the cause may not be known. This pain may take several weeks or longer to get better. HOME CARE INSTRUCTIONS  Pay attention to any changes in your symptoms. Take these actions to help with your pain:   Rest as told by your health care provider.   Avoid activities that cause pain. These include any activities that use your chest muscles or your abdominal and side muscles to lift heavy items.   If directed, apply ice to the painful area:  Put ice in a plastic bag.  Place a towel between your skin and the bag.  Leave the ice on for 20 minutes, 2-3 times per day.  Take over-the-counter and prescription medicines only as told by your health care provider.  Do not use tobacco products, including cigarettes, chewing tobacco, and e-cigarettes. If you need help quitting, ask your health care provider.  Keep all follow-up visits as told by your health care provider. This is important. SEEK MEDICAL CARE IF:  You have a fever.  Your chest pain becomes worse.  You have new symptoms. SEEK IMMEDIATE MEDICAL CARE  IF:  You have nausea or vomiting.  You feel sweaty or light-headed.  You have a cough with phlegm (sputum) or you cough up blood.  You develop shortness of breath.   This information is not intended to replace advice given to you by your health care provider. Make sure you discuss any questions you have with your health care provider.   Document Released: 05/20/2005 Document Revised: 02/08/2015 Document Reviewed: 08/15/2014 Elsevier Interactive Patient Education [employer] Elsevier Inc.

## 2015-05-06 NOTE — ED Provider Notes (Signed)
CSN: [id number]     Arrival date & time 05/06/15  0913 History   First MD Initiated Contact with Patient 05/06/15 0914     Chief Complaint  Patient presents with  . Flank Pain   (Consider location/radiation/quality/duration/timing/severity/associated sxs/prior Treatment) HPI  Pt is a 24yo male [facility] with c/o 1 week hx of Right sided chest pain that is sharp in nature, 4/10 at worst.  Pain is intermittent, worse with certain movements and positions such as lying down or twisting at the waist.  Pain also worse with cough.  He has not taken any pain medications today including acetaminophen or ibuprofen.  He reports hx of cough and congestion that has been waxing and waning for 3 weeks. Denies fever, chills, n/v/d. Denies difficulty breathing. He does report hx of asthma.  He has been using his inhaler intermittently. Reports others sick at home. No recent travel. Pt's biggest concern is his Right sided Rib pain and wants to make sure it is nothing "serious" as he plans on going mountain biking today.  Past Medical History  Diagnosis Date  . Asthma    History reviewed. No pertinent past surgical history. Family History  Problem Relation Age of Onset  . Hypertension Maternal Uncle   . Stroke Maternal Grandmother    Social History  Substance Use Topics  . Smoking status: Never Smoker   . Smokeless tobacco: None  . Alcohol Use: No    Review of Systems  Constitutional: Negative for fever and chills.  HENT: Positive for congestion, rhinorrhea and sneezing. Negative for ear pain, sore throat, trouble swallowing and voice change.   Respiratory: Positive for cough and wheezing. Negative for shortness of breath.   Cardiovascular: Positive for chest pain (Right side). Negative for palpitations.  Gastrointestinal: Negative for nausea, vomiting, abdominal pain and diarrhea.  Genitourinary: Positive for flank pain (Right side). Negative for dysuria, frequency and hematuria.   Musculoskeletal: Negative for myalgias, back pain and arthralgias.  Skin: Negative for rash.    Allergies  Peanuts and Bactrim  Home Medications   Prior to Admission medications   Medication Sig Start Date End Date Taking? Authorizing Provider  Albuterol Sulfate (PROAIR RESPICLICK) 108 (90 BASE) MCG/ACT AEPB Inhale 1-2 puffs into the lungs every 6 (six) hours as needed (cough, wheezing, shortness of breath). 04/12/15   Sean Hommel, DO  azithromycin (ZITHROMAX) 250 MG tablet Take 1 tablet (250 mg total) by mouth daily. Take first 2 tablets together, then 1 every day until finished. 05/06/15   Junius FinnerErin O'Malley, PA-C   Meds Ordered and Administered this Visit  Medications - No data to display  BP 146/76 mmHg  Pulse 71  Temp(Src) 97.6 F (36.4 C) (Oral)  Wt 160 lb (72.576 kg)  SpO2 100% No data found.   Physical Exam  Constitutional: He appears well-developed and well-nourished.  HENT:  Head: Normocephalic and atraumatic.  Right Ear: Hearing, tympanic membrane, external ear and ear canal normal.  Left Ear: Hearing, tympanic membrane, external ear and ear canal normal.  Nose: Nose normal.  Mouth/Throat: Uvula is midline, oropharynx is clear and moist and mucous membranes are normal.  Eyes: Conjunctivae are normal. No scleral icterus.  Neck: Normal range of motion. Neck supple.  Cardiovascular: Normal rate, regular rhythm and normal heart sounds.   Pulmonary/Chest: Effort normal and breath sounds normal. No respiratory distress. He has no wheezes. He has no rales. He exhibits tenderness ( mild Right side over Ribs 5-9 anterior and lateral).  Abdominal: Soft.  He exhibits no distension and no mass. There is no tenderness. There is no rebound and no guarding.  Musculoskeletal: Normal range of motion.  Neurological: He is alert.  Skin: Skin is warm and dry.  Nursing note and vitals reviewed.   ED Course  Procedures (including critical care time)  Labs Review Labs Reviewed - No  data to display  Imaging Review Dg Ribs Unilateral W/chest Right  05/06/2015  CLINICAL DATA:  Cough for 2 weeks. Right rib pain beginning on Thursday. EXAM: RIGHT RIBS AND CHEST - 3+ VIEW COMPARISON:  None. FINDINGS: No fracture or other bone lesions are seen involving the ribs. There is no evidence of pneumothorax or pleural effusion. Both lungs are clear. Heart size and mediastinal contours are within normal limits. IMPRESSION: Negative. Electronically Signed   By: Marnee Spring M.D.   On: 05/06/2015 10:13      MDM   1. Right-sided chest wall pain   2. Cough   3. Acute upper respiratory infection    Intermittent cough and congestion for 3 weeks, no associated Right sided chest wall pain for 1 week. Hx of asthma.  He has an inhaler at home he uses.  Pain reproducible with certain movements and palpation. Pain likely muscular in nature from 3 weeks of cough.  CXR: no evidence of rib fracture, chest lesion or pneumonia   Due to hx of asthma and 3 weeks of cough, will place pt on Azithromycin to cover atypical bacteria Reassured pt of CXR.  He may go mountain biking but encouraged pt to use ice packs when he gets back. He may also take acetaminophen and ibuprofen. Patient verbalized understanding and agreement with treatment plan.    Junius Finner, PA-C 05/06/15 1028

## 2015-05-10 ENCOUNTER — Encounter: Payer: Self-pay | Admitting: Family Medicine

## 2015-05-10 ENCOUNTER — Ambulatory Visit (INDEPENDENT_AMBULATORY_CARE_PROVIDER_SITE_OTHER): Payer: PRIVATE HEALTH INSURANCE | Admitting: Family Medicine

## 2015-05-10 VITALS — BP 138/77 | HR 64 | Wt 158.0 lb

## 2015-05-10 DIAGNOSIS — R0789 Other chest pain: Secondary | ICD-10-CM

## 2015-05-10 MED ORDER — METHOCARBAMOL 500 MG PO TABS: 500.0000 mg | ORAL_TABLET | Freq: Four times a day (QID) | ORAL | Status: DC | PRN

## 2015-05-10 MED ORDER — TRAMADOL HCL 50 MG PO TABS: 50.0000 mg | ORAL_TABLET | Freq: Three times a day (TID) | ORAL | Status: DC | PRN

## 2015-05-10 NOTE — Progress Notes (Signed)
CC: Ross Wagner is a 24 y.o. male is here for Right Side Pain   Subjective: HPI:  Right-sided chest wall pain present for the past 5 days. It came on out of nowhere. He believes it was originally due to coughing. It was slightly getting better with rest and waiting however this morning when getting into a car he felt a pop in the right flank and pain began to feel like more of an ache rather than staff. It spreads to the front and the back of the right chest wall. Mild in severity. Worse with lying on the right side or any twisting maneuvers. He denies any overlying skin changes. He denies any wheezing, shortness of breath, pain with breathing, right upper quadrant pain, gastrointestinal complaints. He denies fevers, chills or cough. Pain is 100% absent if he sitting still  Review Of Systems Outlined In HPI  Past Medical History  Diagnosis Date  . Asthma     No past surgical history on file. Family History  Problem Relation Age of Onset  . Hypertension Maternal Uncle   . Stroke Maternal Grandmother     Social History   Social History  . Marital Status: Married    Spouse Name: N/A  . Number of Children: N/A  . Years of Education: N/A   Occupational History  . Not on file.   Social History Main Topics  . Smoking status: Never Smoker   . Smokeless tobacco: Not on file  . Alcohol Use: No  . Drug Use: No  . Sexual Activity: Yes   Other Topics Concern  . Not on file   Social History Narrative     Objective: BP 138/77 mmHg  Pulse 64  Wt 158 lb (71.668 kg)  General: Alert and Oriented, No Acute Distress HEENT: Pupils equal, round, reactive to light. Conjunctivae clear.  Moist mucous membranes Lungs: Clear to auscultation bilaterally, no wheezing/ronchi/rales.  Comfortable work of breathing. Good air movement. Cardiac: Regular rate and rhythm. Normal S1/S2.  No murmurs, rubs, nor gallops.   Extremities: No peripheral edema.  Strong peripheral pulses.  Mental Status:  No depression, anxiety, nor agitation. Skin: Warm and dry. No overlying skin changes at site of discomfort  Assessment & Plan: Ross Wagner was seen today for right side pain.  Diagnoses and all orders for this visit:  Chest wall pain -     methocarbamol (ROBAXIN) 500 MG tablet; Take 1 tablet (500 mg total) by mouth 4 (four) times daily as needed (chest wall pain). -     traMADol (ULTRAM) 50 MG tablet; Take 1 tablet (50 mg total) by mouth every 8 (eight) hours as needed for moderate pain.   Chest wall pain most likely due to muscle strain, discussed my reluctance about getting an x-ray since he has no pain if he sitting still and has not had any shortness of breath. Low likelihood of pulmonary or rib abnormality. He was also given a handout including home exercises to do over the next 2 weeks.  Return if symptoms worsen or fail to improve.

## 2015-10-13 ENCOUNTER — Encounter: Payer: Self-pay | Admitting: *Deleted

## 2015-10-13 ENCOUNTER — Emergency Department
Admission: EM | Admit: 2015-10-13 | Discharge: 2015-10-13 | Disposition: A | Discharge status: Home / Self Care | Payer: PRIVATE HEALTH INSURANCE | Source: Home / Self Care | Attending: Emergency Medicine | Admitting: Emergency Medicine

## 2015-10-13 DIAGNOSIS — L03115 Cellulitis of right lower limb: Secondary | ICD-10-CM

## 2015-10-13 DIAGNOSIS — W57XXXA Bitten or stung by nonvenomous insect and other nonvenomous arthropods, initial encounter: Secondary | ICD-10-CM | POA: Diagnosis not present

## 2015-10-13 DIAGNOSIS — S70361A Insect bite (nonvenomous), right thigh, initial encounter: Secondary | ICD-10-CM

## 2015-10-13 MED ORDER — DOXYCYCLINE HYCLATE 100 MG PO CAPS: 100.0000 mg | ORAL_CAPSULE | Freq: Two times a day (BID) | ORAL | Status: DC

## 2015-10-13 NOTE — ED Provider Notes (Signed)
CSN: [id number]     Arrival date & time 10/13/15  1903 History   First MD Initiated Contact with Patient 10/13/15 1904     Chief Complaint  Patient presents with  . Insect Bite   [facility]  HPI Pt c/o possible insect bite to right inner thigh that he noticed yesterday AM.  He has since noted increas in area of surrounding redness. He feels otherwise asymptomatic. No fever.No nausea or vomiting or chest pain or shortness of breath. Past Medical History  Diagnosis Date  . Asthma    History reviewed. No pertinent past surgical history. Family History  Problem Relation Age of Onset  . Hypertension Maternal Uncle   . Stroke Maternal Grandmother    Social History  Substance Use Topics  . Smoking status: Never Smoker   . Smokeless tobacco: None  . Alcohol Use: No    Review of Systems  All other systems reviewed and are negative.   Allergies  Peanuts and Bactrim  Home Medications   Prior to Admission medications   Medication Sig Start Date End Date Taking? Authorizing Provider  Albuterol Sulfate (PROAIR RESPICLICK) 108 (90 BASE) MCG/ACT AEPB Inhale 1-2 puffs into the lungs every 6 (six) hours as needed (cough, wheezing, shortness of breath). 04/12/15   Laren BoomSean Hommel, DO  doxycycline (VIBRAMYCIN) 100 MG capsule Take 1 capsule (100 mg total) by mouth 2 (two) times daily. 10/13/15   Lajean Manesavid Massey, MD   Meds Ordered and Administered this Visit  Medications - No data to display  BP 158/84 mmHg  Pulse 67  Temp(Src) 98.6 F (37 C) (Oral)  Resp 14  Wt 161 lb (73.029 kg)  SpO2 100% No data found.   Physical Exam  Constitutional: He is oriented to person, place, and time. He appears well-developed and well-nourished. No distress.  HENT:  Head: Normocephalic and atraumatic.  Eyes: Conjunctivae and EOM are normal. Pupils are equal, round, and reactive to light. No scleral icterus.  Neck: Normal range of motion.  Cardiovascular: Normal rate.    Pulmonary/Chest: Effort normal.  Abdominal: He exhibits no distension.  Musculoskeletal: Normal range of motion.  Neurological: He is alert and oriented to person, place, and time.  Skin: Skin is warm.     Psychiatric: He has a normal mood and affect.  Nursing note and vitals reviewed.   ED Course  Procedures (including critical care time)  Labs Review Labs Reviewed - No data to display  Imaging Review No results found.   Visual Acuity Review  Right Eye Distance:   Left Eye Distance:   Bilateral Distance:    Right Eye Near:   Left Eye Near:    Bilateral Near:         MDM   1. Cellulitis of right thigh   2. Insect bite of right thigh, initial encounter    Treatment options discussed, as well as risks, benefits, alternatives. Patient voiced understanding and agreement with the following plans:  Alcohol swab, then used sterile forceps to scrape the papule, no foreign body or tick head seen. He tolerated well. No bleeding or exudate.  Bacitracin and Band-Aid applied. Antibiotic coverage with doxycycline. doxycycline (VIBRAMYCIN) 100 MG capsule Take 1 capsule (100 mg total) by mouth 2 (two) times daily. 20 capsule    Warm compresses and other symptomatic care. Follow-up with your primary care doctor in 5-7 days if not improving, or sooner if symptoms become worse. Precautions discussed. Red flags discussed. Questions invited  and answered. Patient voiced understanding and agreement.    Lajean Manes, MD 10/16/15 1247

## 2015-10-13 NOTE — ED Notes (Signed)
Pt c/o possible insect bite to right inner thigh that he noticed yesterday AM. He has since noted increas in area of surrounding redness. He feels otherwise asymptomatic.

## 2015-10-16 ENCOUNTER — Telehealth: Payer: Self-pay | Admitting: *Deleted

## 2015-10-20 ENCOUNTER — Ambulatory Visit (INDEPENDENT_AMBULATORY_CARE_PROVIDER_SITE_OTHER): Payer: PRIVATE HEALTH INSURANCE

## 2015-10-20 ENCOUNTER — Ambulatory Visit (INDEPENDENT_AMBULATORY_CARE_PROVIDER_SITE_OTHER): Payer: PRIVATE HEALTH INSURANCE | Admitting: Family Medicine

## 2015-10-20 ENCOUNTER — Encounter: Payer: Self-pay | Admitting: Family Medicine

## 2015-10-20 VITALS — BP 138/83 | HR 78 | Wt 163.0 lb

## 2015-10-20 DIAGNOSIS — M25559 Pain in unspecified hip: Secondary | ICD-10-CM | POA: Insufficient documentation

## 2015-10-20 DIAGNOSIS — M25552 Pain in left hip: Secondary | ICD-10-CM

## 2015-10-20 NOTE — Patient Instructions (Signed)
Thank you for coming in today. Return in 2 weeks if not better.  Take ibuprofen for pain and use ice.  Take Pepcid or Prilosec with the ibuprofen to prevent stomach ulcers.  Use the crutches as needed.  Attend PT.    Cryotherapy Cryotherapy means treatment with cold. Ice or gel packs can be used to reduce both pain and swelling. Ice is the most helpful within the first 24 to 48 hours after an injury or flare-up from overusing a muscle or joint. Sprains, strains, spasms, burning pain, shooting pain, and aches can all be eased with ice. Ice can also be used when recovering from surgery. Ice is effective, has very few side effects, and is safe for most people to use. PRECAUTIONS  Ice is not a safe treatment option for people with:  Raynaud phenomenon. This is a condition affecting small blood vessels in the extremities. Exposure to cold may cause your problems to return.  Cold hypersensitivity. There are many forms of cold hypersensitivity, including:  Cold urticaria. Red, itchy hives appear on the skin when the tissues begin to warm after being iced.  Cold erythema. This is a red, itchy rash caused by exposure to cold.  Cold hemoglobinuria. Red blood cells break down when the tissues begin to warm after being iced. The hemoglobin that carry oxygen are passed into the urine because they cannot combine with blood proteins fast enough.  Numbness or altered sensitivity in the area being iced. If you have any of the following conditions, do not use ice until you have discussed cryotherapy with your caregiver:  Heart conditions, such as arrhythmia, angina, or chronic heart disease.  High blood pressure.  Healing wounds or open skin in the area being iced.  Current infections.  Rheumatoid arthritis.  Poor circulation.  Diabetes. Ice slows the blood flow in the region it is applied. This is beneficial when trying to stop inflamed tissues from spreading irritating chemicals to surrounding  tissues. However, if you expose your skin to cold temperatures for too long or without the proper protection, you can damage your skin or nerves. Watch for signs of skin damage due to cold. HOME CARE INSTRUCTIONS Follow these tips to use ice and cold packs safely.  Place a dry or damp towel between the ice and skin. A damp towel will cool the skin more quickly, so you may need to shorten the time that the ice is used.  For a more rapid response, add gentle compression to the ice.  Ice for no more than 10 to 20 minutes at a time. The bonier the area you are icing, the less time it will take to get the benefits of ice.  Check your skin after 5 minutes to make sure there are no signs of a poor response to cold or skin damage.  Rest 20 minutes or more between uses.  Once your skin is numb, you can end your treatment. You can test numbness by very lightly touching your skin. The touch should be so light that you do not see the skin dimple from the pressure of your fingertip. When using ice, most people will feel these normal sensations in this order: cold, burning, aching, and numbness.  Do not use ice on someone who cannot communicate their responses to pain, such as small children or people with dementia. HOW TO MAKE AN ICE PACK Ice packs are the most common way to use ice therapy. Other methods include ice massage, ice baths, and cryosprays. Muscle  creams that cause a cold, tingly feeling do not offer the same benefits that ice offers and should not be used as a substitute unless recommended by your caregiver. To make an ice pack, do one of the following:  Place crushed ice or a bag of frozen vegetables in a sealable plastic bag. Squeeze out the excess air. Place this bag inside another plastic bag. Slide the bag into a pillowcase or place a damp towel between your skin and the bag.  Mix 3 parts water with 1 part rubbing alcohol. Freeze the mixture in a sealable plastic bag. When you remove the  mixture from the freezer, it will be slushy. Squeeze out the excess air. Place this bag inside another plastic bag. Slide the bag into a pillowcase or place a damp towel between your skin and the bag. SEEK MEDICAL CARE IF:  You develop white spots on your skin. This may give the skin a blotchy (mottled) appearance.  Your skin turns blue or pale.  Your skin becomes waxy or hard.  Your swelling gets worse. MAKE SURE YOU:   Understand these instructions.  Will watch your condition.  Will get help right away if you are not doing well or get worse.   This information is not intended to replace advice given to you by your health care provider. Make sure you discuss any questions you have with your health care provider.   Document Released: 01/14/2011 Document Revised: 06/10/2014 Document Reviewed: 01/14/2011 Elsevier Interactive Patient Education [employer]
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
## 2015-10-20 NOTE — Progress Notes (Signed)
Quick Note:  Xray read as normal ______

## 2015-12-05 IMAGING — CR DG RIBS W/ CHEST 3+V*R*
4 series · 4 of 4 positions shown · non-contrast
Comparison: None.

CLINICAL DATA: Cough for 2 weeks. Right rib pain beginning on
[REDACTED].

EXAM:
RIGHT RIBS AND CHEST - 3+ VIEW

[chest pa]
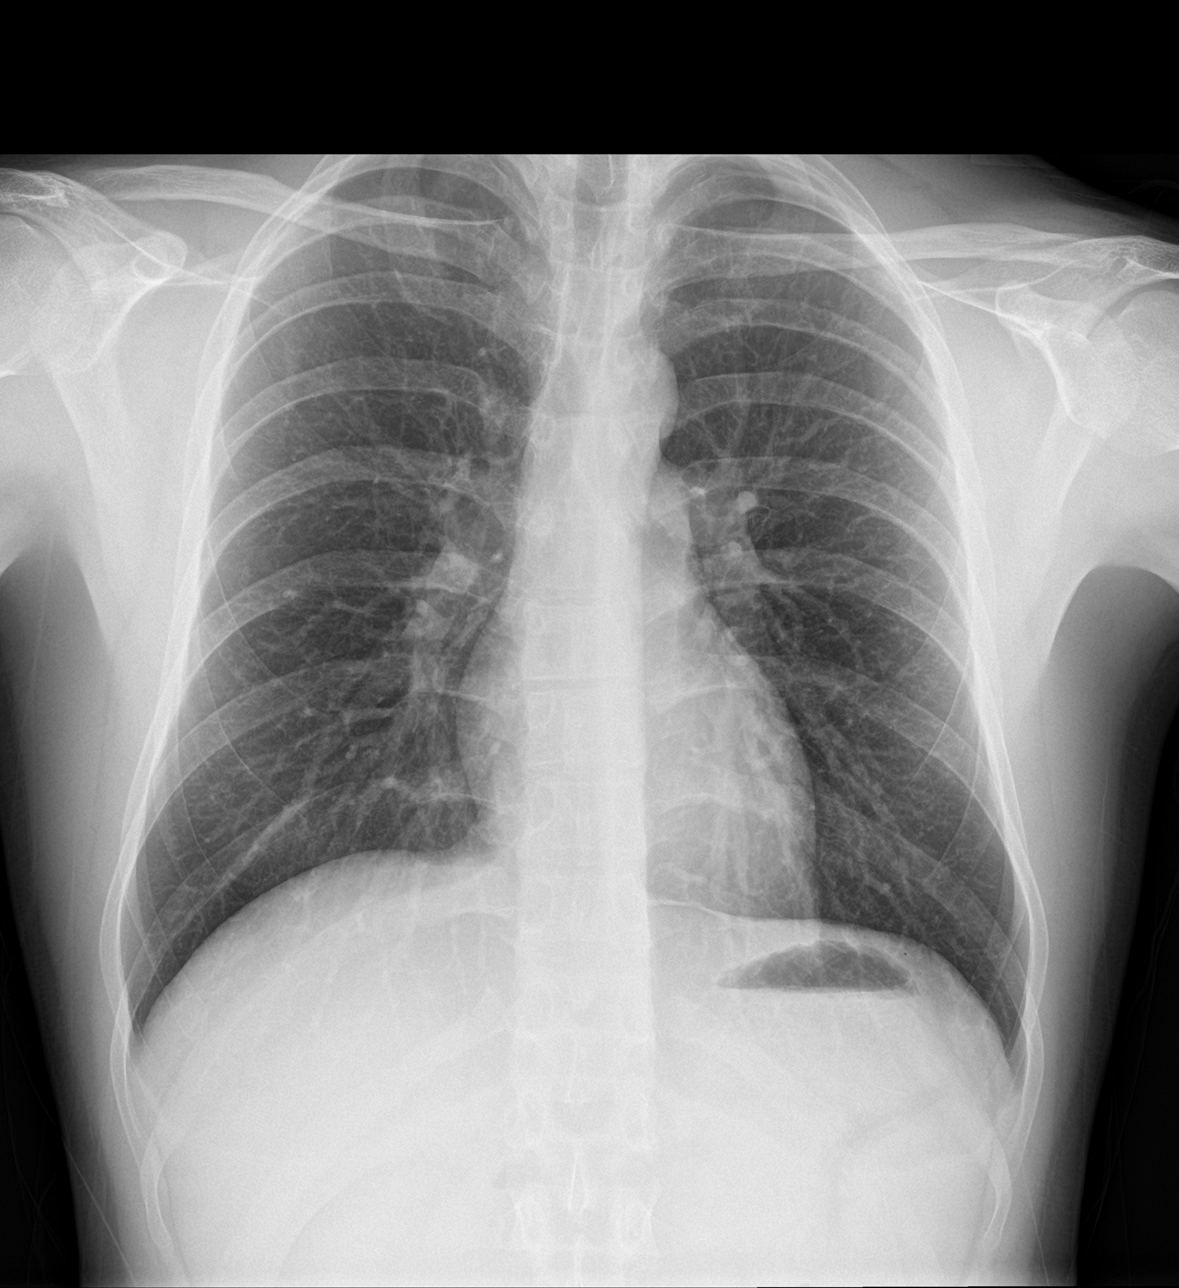

[rib ap (1 of 2)]
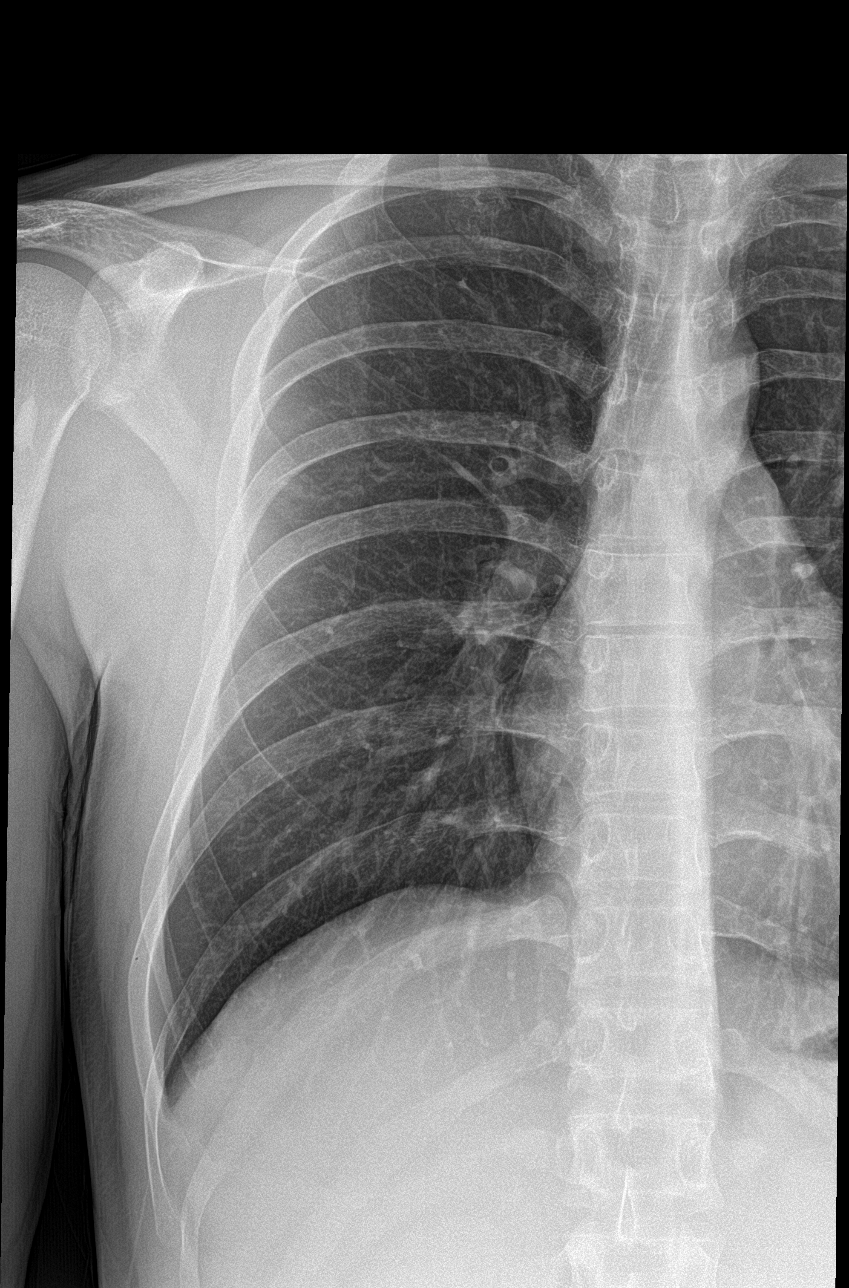

[rib ap (2 of 2)]
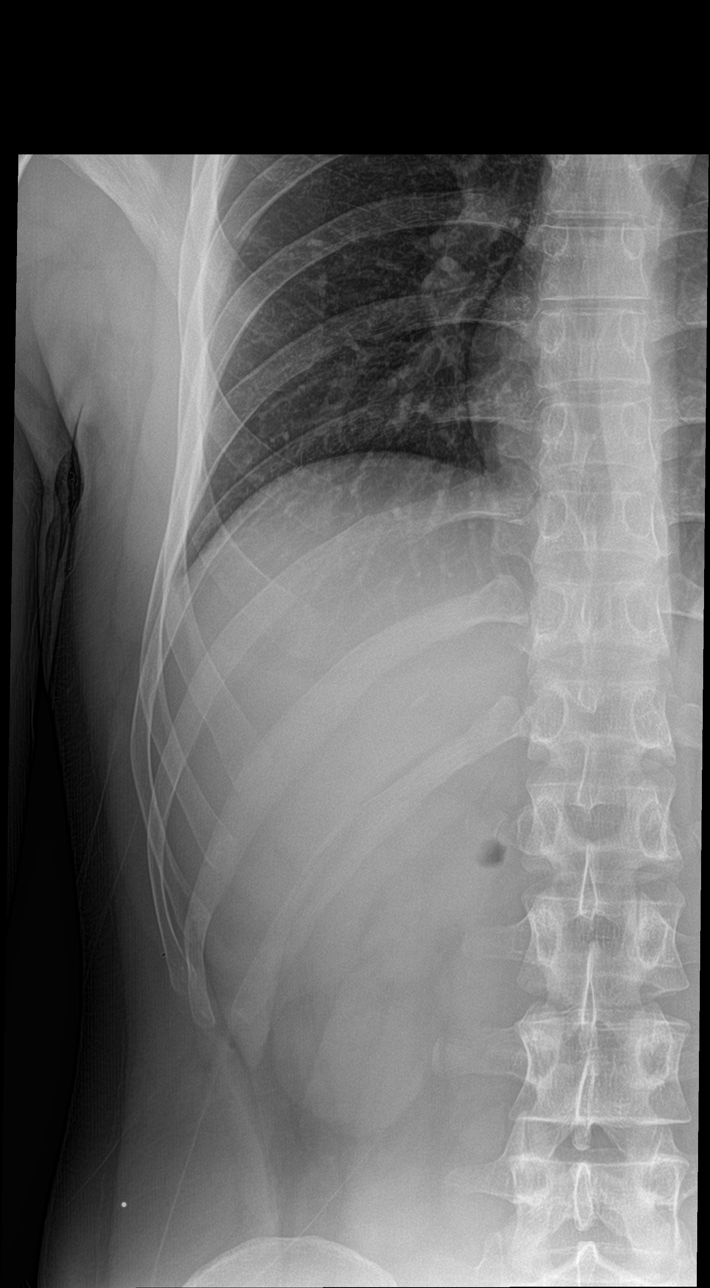

[rib ap obl]
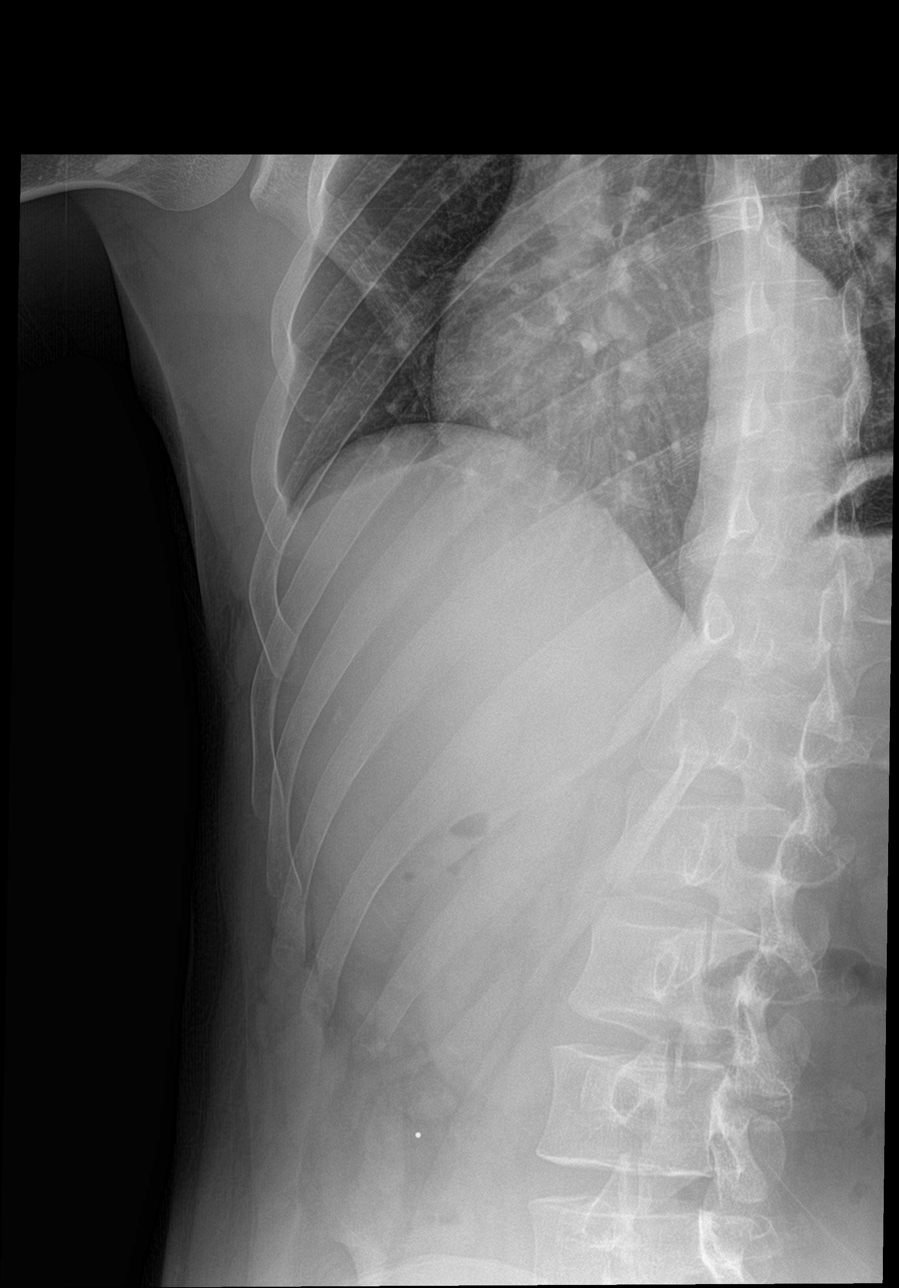

[4 of 4 positions shown; findings below may reference images not displayed]

FINDINGS: No fracture or other bone lesions are seen involving the ribs. There
is no evidence of pneumothorax or pleural effusion. Both lungs are
clear. Heart size and mediastinal contours are within normal limits.
IMPRESSION: Negative.

## 2015-12-08 ENCOUNTER — Ambulatory Visit: Payer: Self-pay | Admitting: Osteopathic Medicine

## 2015-12-12 ENCOUNTER — Encounter: Payer: Self-pay | Admitting: Family Medicine

## 2015-12-12 ENCOUNTER — Ambulatory Visit (INDEPENDENT_AMBULATORY_CARE_PROVIDER_SITE_OTHER): Payer: PRIVATE HEALTH INSURANCE | Admitting: Family Medicine

## 2015-12-12 VITALS — BP 142/76 | HR 61 | Wt 163.0 lb

## 2015-12-12 DIAGNOSIS — N4889 Other specified disorders of penis: Secondary | ICD-10-CM

## 2015-12-12 DIAGNOSIS — L309 Dermatitis, unspecified: Secondary | ICD-10-CM | POA: Diagnosis not present

## 2015-12-12 MED ORDER — TRIAMCINOLONE ACETONIDE 0.1 % EX CREA: TOPICAL_CREAM | CUTANEOUS | Status: AC

## 2015-12-12 NOTE — Progress Notes (Signed)
CC: Ross Wagner is a 25 y.o. male is here for Rash   Subjective: HPI:  1 week ago he began to get itching on the head of his penis slowly began to spread proximally on the shaft. It also had a flaky appearance with a dried texture. It never hurt but it was extremely itchy. It started to get a little bit better with Vaseline however has reached a plateau. He denies any skin changes elsewhere. His wife is not having any vaginal complaints. He denies any penile discharge, symptoms are worse when he has an erection. Symptoms are currently mild to moderate in severity. He denies any other genital complaints. Denies skin changes elsewhere he's never had this before. Denies fevers, chills, swollen lymph nodes.   Review Of Systems Outlined In HPI  Past Medical History  Diagnosis Date  . Asthma     No past surgical history on file. Family History  Problem Relation Age of Onset  . Hypertension Maternal Uncle   . Stroke Maternal Grandmother     Social History   Social History  . Marital Status: Married    Spouse Name: N/A  . Number of Children: N/A  . Years of Education: N/A   Occupational History  . Not on file.   Social History Main Topics  . Smoking status: Never Smoker   . Smokeless tobacco: Not on file  . Alcohol Use: No  . Drug Use: No  . Sexual Activity: Yes   Other Topics Concern  . Not on file   Social History Narrative     Objective: BP 142/76 mmHg  Pulse 61  Wt 163 lb (73.936 kg)  Vital signs reviewed. General: Alert and Oriented, No Acute Distress HEENT: Pupils equal, round, reactive to light. Conjunctivae clear.  External ears unremarkable.  Moist mucous membranes. Lungs: Clear and comfortable work of breathing, speaking in full sentences without accessory muscle use. Cardiac: Regular rate and rhythm.  Neuro: CN II-XII grossly intact, gait normal. No mucosal lesions on the penis however on the shaft of penis there is a mild eczematous  appearance. Extremities: No peripheral edema.  Strong peripheral pulses.  Mental Status: No depression, anxiety, nor agitation. Logical though process. Skin: Warm and dry. Assessment & Plan: [name] was seen today for rash.  Diagnoses and all orders for this visit:  Penile pain -     triamcinolone cream (KENALOG) 0.1 %; Apply to affected areas twice a day for up to two weeks, avoid face.  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 I gave him my personal cell phone number so that he can call me when I'm out of the office later this week if he's having worsening symptoms which would be suggestive of a fungal infection.  No Follow-up on file.

## 2018-04-04 ENCOUNTER — Emergency Department (INDEPENDENT_AMBULATORY_CARE_PROVIDER_SITE_OTHER)
Admission: EM | Admit: 2018-04-04 | Discharge: 2018-04-04 | Disposition: A | Discharge status: Home / Self Care | Payer: 59 | Source: Home / Self Care | Attending: Family Medicine | Admitting: Family Medicine

## 2018-04-04 ENCOUNTER — Emergency Department (INDEPENDENT_AMBULATORY_CARE_PROVIDER_SITE_OTHER): Payer: 59

## 2018-04-04 ENCOUNTER — Encounter: Payer: Self-pay | Admitting: Emergency Medicine

## 2018-04-04 DIAGNOSIS — S4992XA Unspecified injury of left shoulder and upper arm, initial encounter: Secondary | ICD-10-CM | POA: Diagnosis not present

## 2018-04-04 DIAGNOSIS — M25512 Pain in left shoulder: Secondary | ICD-10-CM | POA: Diagnosis not present

## 2018-04-04 MED ORDER — CYCLOBENZAPRINE HCL 10 MG PO TABS: 10.0000 mg | ORAL_TABLET | Freq: Two times a day (BID) | ORAL | 0 refills | Status: DC | PRN

## 2018-04-04 NOTE — Discharge Instructions (Addendum)
°  You may take 500mg  acetaminophen every 4-6 hours or in combination with ibuprofen 400-600mg  every 6-8 hours as needed for pain and inflammation.  Flexeril (cyclobenzaprine) is a muscle relaxer and may cause drowsiness. Do not drink alcohol, drive, or operate heavy machinery while taking.   Please schedule a follow up visit with Sports Medicine in 1 week if not improving as you may have injured your rotator cuff. Additional imaging or treatment may be indicated.  In the meantime, it is recommended you try the range of motion exercises at home to help prevent your shoulder from becoming too stiff.

## 2018-04-04 NOTE — ED Triage Notes (Signed)
Pt states he was snowboarding about 4 hours ago and fell and landed on his left arm. C/o shoulder pain. Denies meds.

## 2018-04-04 NOTE — ED Provider Notes (Signed)
[facility]    CSN: [id number] Arrival date & time: 04/04/18  1732     History   Chief Complaint Chief Complaint  Patient presents with  . Shoulder Pain    HPI Ross Wagner is a 27 y.o. male.   HPI Ross Wagner is a 27 y.o. male presenting to UC with c/o sudden onset Left shoulder pain after falling and landing on his Left shoulder while snowboarding at [facility] in [place] 4 hours PTA.  Pt felt a "crunch or a pop." pain is aching and sore, worse with certain movements, mild stiffness. Denies numbness. No prior fracture or dislocation to his shoulder. Denies hitting his head or other injuries.    Past Medical History:  Diagnosis Date  . Asthma     Patient Active Problem List   Diagnosis Date Noted  . Hip pain 10/20/2015  . Reactive airway disease 04/17/2015  . Abdominal pain, epigastric 11/20/2012    History reviewed. No pertinent surgical history.     Home Medications    Prior to Admission medications   Medication Sig Start Date End Date Taking? Authorizing Provider  cyclobenzaprine (FLEXERIL) 10 MG tablet Take 1 tablet (10 mg total) by mouth 2 (two) times daily as needed. 04/04/18   Lurene Shadow, PA-C    Family History Family History  Problem Relation Age of Onset  . Hypertension Maternal Uncle   . Stroke Maternal Grandmother     Social History Social History   Tobacco Use  . Smoking status: Never Smoker  . Smokeless tobacco: Never Used  Substance Use Topics  . Alcohol use: No  . Drug use: No     Allergies   Peanuts [peanut oil] and Bactrim [sulfamethoxazole-trimethoprim]   Review of Systems Review of Systems  Musculoskeletal: Positive for arthralgias and myalgias.  Skin: Negative for color change and wound.  Neurological: Positive for weakness. Negative for numbness.     Physical Exam Triage Vital Signs ED Triage Vitals  Enc Vitals Group     BP 04/04/18 1746 (!) 175/81     Pulse Rate 04/04/18 1746 68       Resp --      Temp 04/04/18 1746 98 F (36.7 C)     Temp Source 04/04/18 1746 Oral     SpO2 04/04/18 1746 100 %     Weight 04/04/18 1747 170 lb (77.1 kg)     Height --      Head Circumference --      Peak Flow --      Pain Score 04/04/18 1746 2     Pain Loc --      Pain Edu? --      Excl. in GC? --    No data found.  Updated Vital Signs BP (!) 175/81 (BP Location: Right Arm)   Pulse 68   Temp 98 F (36.7 C) (Oral)   Wt 170 lb (77.1 kg)   SpO2 100%   BMI 26.63 kg/m   Visual Acuity Right Eye Distance:   Left Eye Distance:   Bilateral Distance:    Right Eye Near:   Left Eye Near:    Bilateral Near:     Physical Exam  Constitutional: He is oriented to person, place, and time. He appears well-developed and well-nourished.  HENT:  Head: Normocephalic and atraumatic.  Eyes: EOM are normal.  Neck: Normal range of motion.  Cardiovascular: Normal rate.  Pulmonary/Chest: Effort normal.  Musculoskeletal: He exhibits tenderness.  Left shoulder:  no obvious deformity. Diffuse tenderness. Limited ROM but he is able to touch his Right shoulder with his Left hand. Full ROM elbow, non-tender. No spinal tenderness.  5/5 grip strength.  Neurological: He is alert and oriented to person, place, and time.  Skin: Skin is warm and dry.  Psychiatric: He has a normal mood and affect. His behavior is normal.  Nursing note and vitals reviewed.    UC Treatments / Results  Labs (all labs ordered are listed, but only abnormal results are displayed) Labs Reviewed - No data to display  EKG None  Radiology Dg Shoulder Left  Result Date: 04/04/2018 CLINICAL DATA:  Pain after fall EXAM: LEFT SHOULDER - 2+ VIEW COMPARISON:  None. FINDINGS: There is no evidence of fracture or dislocation. There is no evidence of arthropathy or other focal bone abnormality. Soft tissues are unremarkable. IMPRESSION: Negative. Electronically Signed   By: Gerome Sam III M.D   On: 04/04/2018 17:59     Procedures Procedures (including critical care time)  Medications Ordered in UC Medications - No data to display  Initial Impression / Assessment and Plan / UC Course  I have reviewed the triage vital signs and the nursing notes.  Pertinent labs & imaging results that were available during my care of the patient were reviewed by me and considered in my medical decision making (see chart for details).     Discussed imaging with pt. Advised pt he may still have a small rotator cuff tear. Pt has a sling he has been using. May continue to use for comfort but encouraged home ROM exercises to help prevent frozen shoulder. Encouraged f/u with sports medicine.  Final Clinical Impressions(s) / UC Diagnoses   Final diagnoses:  Injury of left shoulder     Discharge Instructions      You may take 500mg  acetaminophen every 4-6 hours or in combination with ibuprofen 400-600mg  every 6-8 hours as needed for pain and inflammation.  Flexeril (cyclobenzaprine) is a muscle relaxer and may cause drowsiness. Do not drink alcohol, drive, or operate heavy machinery while taking.   Please schedule a follow up visit with Sports Medicine in 1 week if not improving as you may have injured your rotator cuff. Additional imaging or treatment may be indicated.  In the meantime, it is recommended you try the range of motion exercises at home to help prevent your shoulder from becoming too stiff.      ED Prescriptions    Medication Sig Dispense Auth. Provider   cyclobenzaprine (FLEXERIL) 10 MG tablet Take 1 tablet (10 mg total) by mouth 2 (two) times daily as needed. 20 tablet Lurene Shadow, PA-C     Controlled Substance Prescriptions [place] Controlled Substance Registry consulted? Not Applicable   Rolla Plate 04/05/18 1229

## 2018-04-05 ENCOUNTER — Telehealth: Payer: Self-pay | Admitting: Emergency Medicine

## 2018-04-05 NOTE — Telephone Encounter (Signed)
Attempted to contact x1. Left message to return call. 

## 2018-07-28 ENCOUNTER — Ambulatory Visit: Payer: 59 | Admitting: Osteopathic Medicine

## 2018-11-03 IMAGING — DX DG SHOULDER 2+V*L*
3 series · 3 of 3 positions shown · non-contrast
Comparison: None.

CLINICAL DATA: Pain after fall

EXAM:
LEFT SHOULDER - 2+ VIEW

[shoulder grashey]
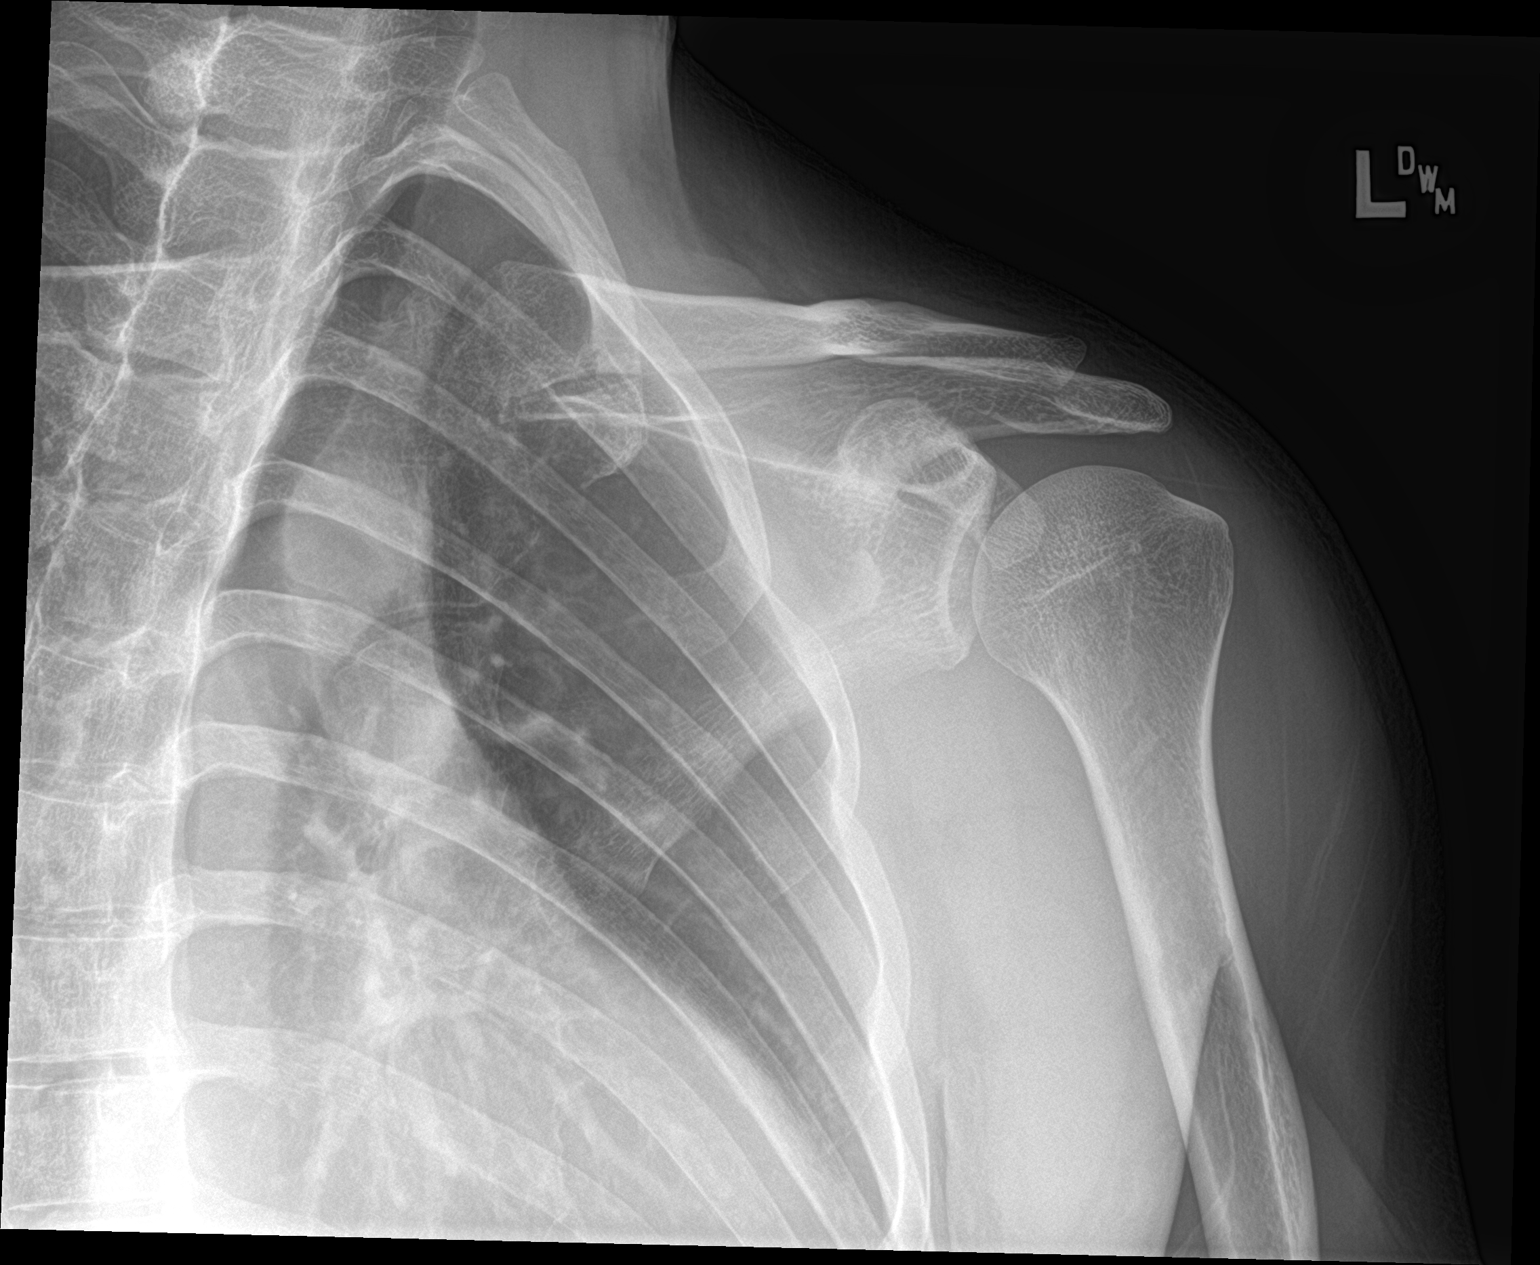

[shoulder y view]
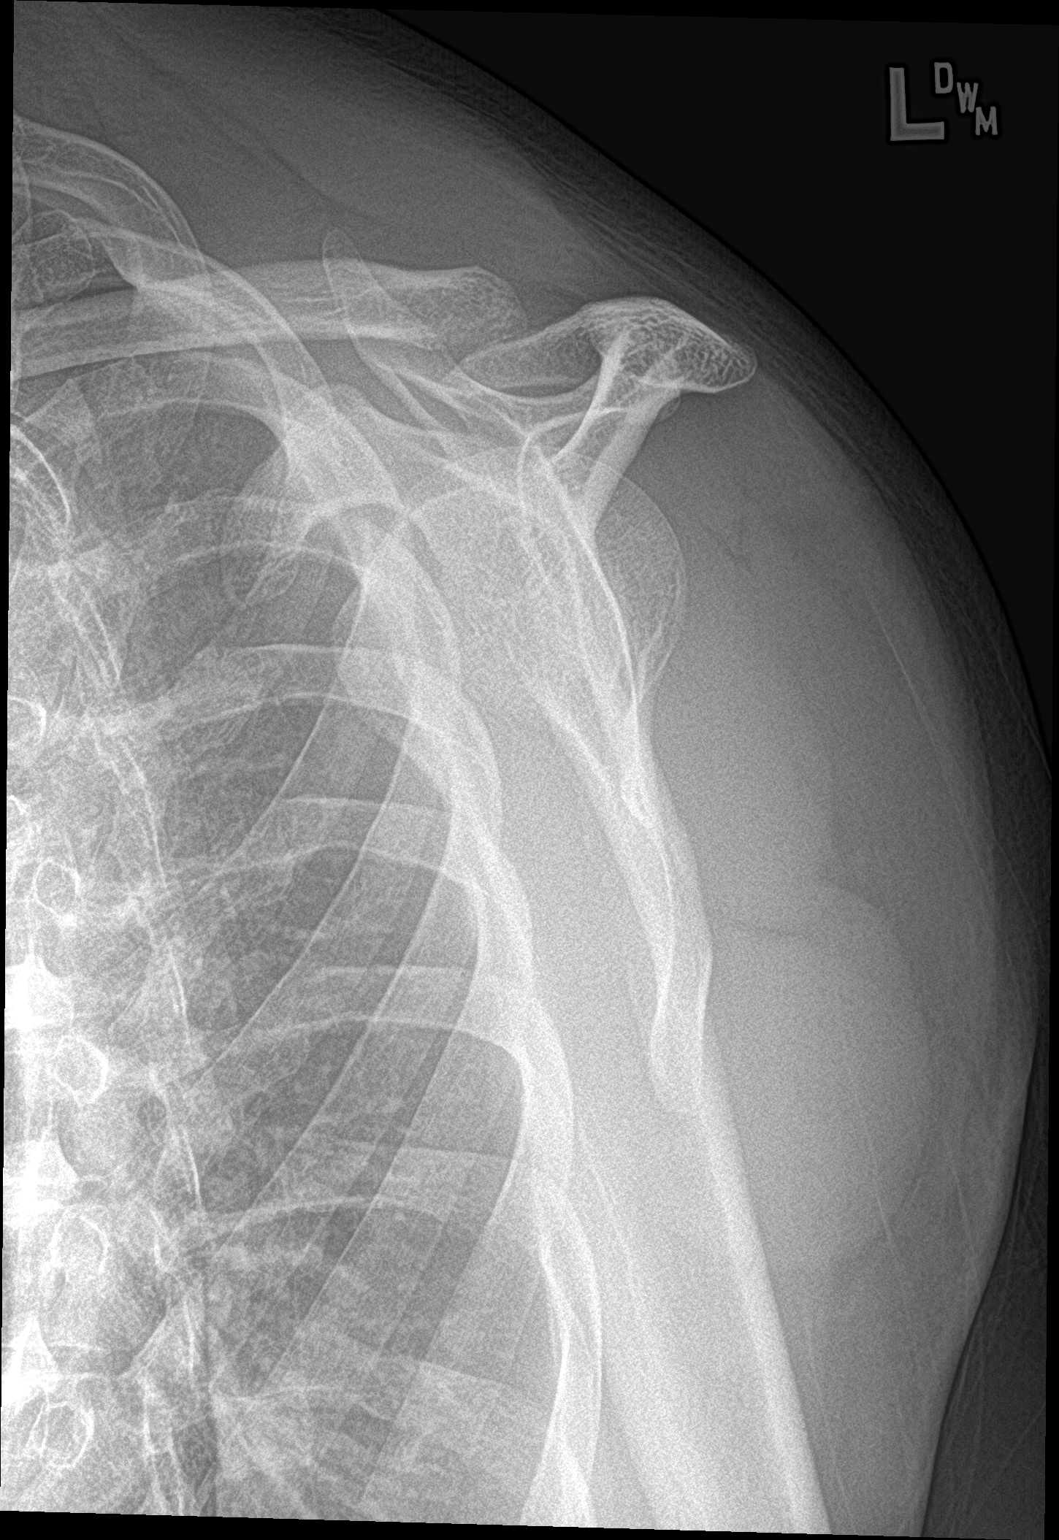

[shoulder axillary]
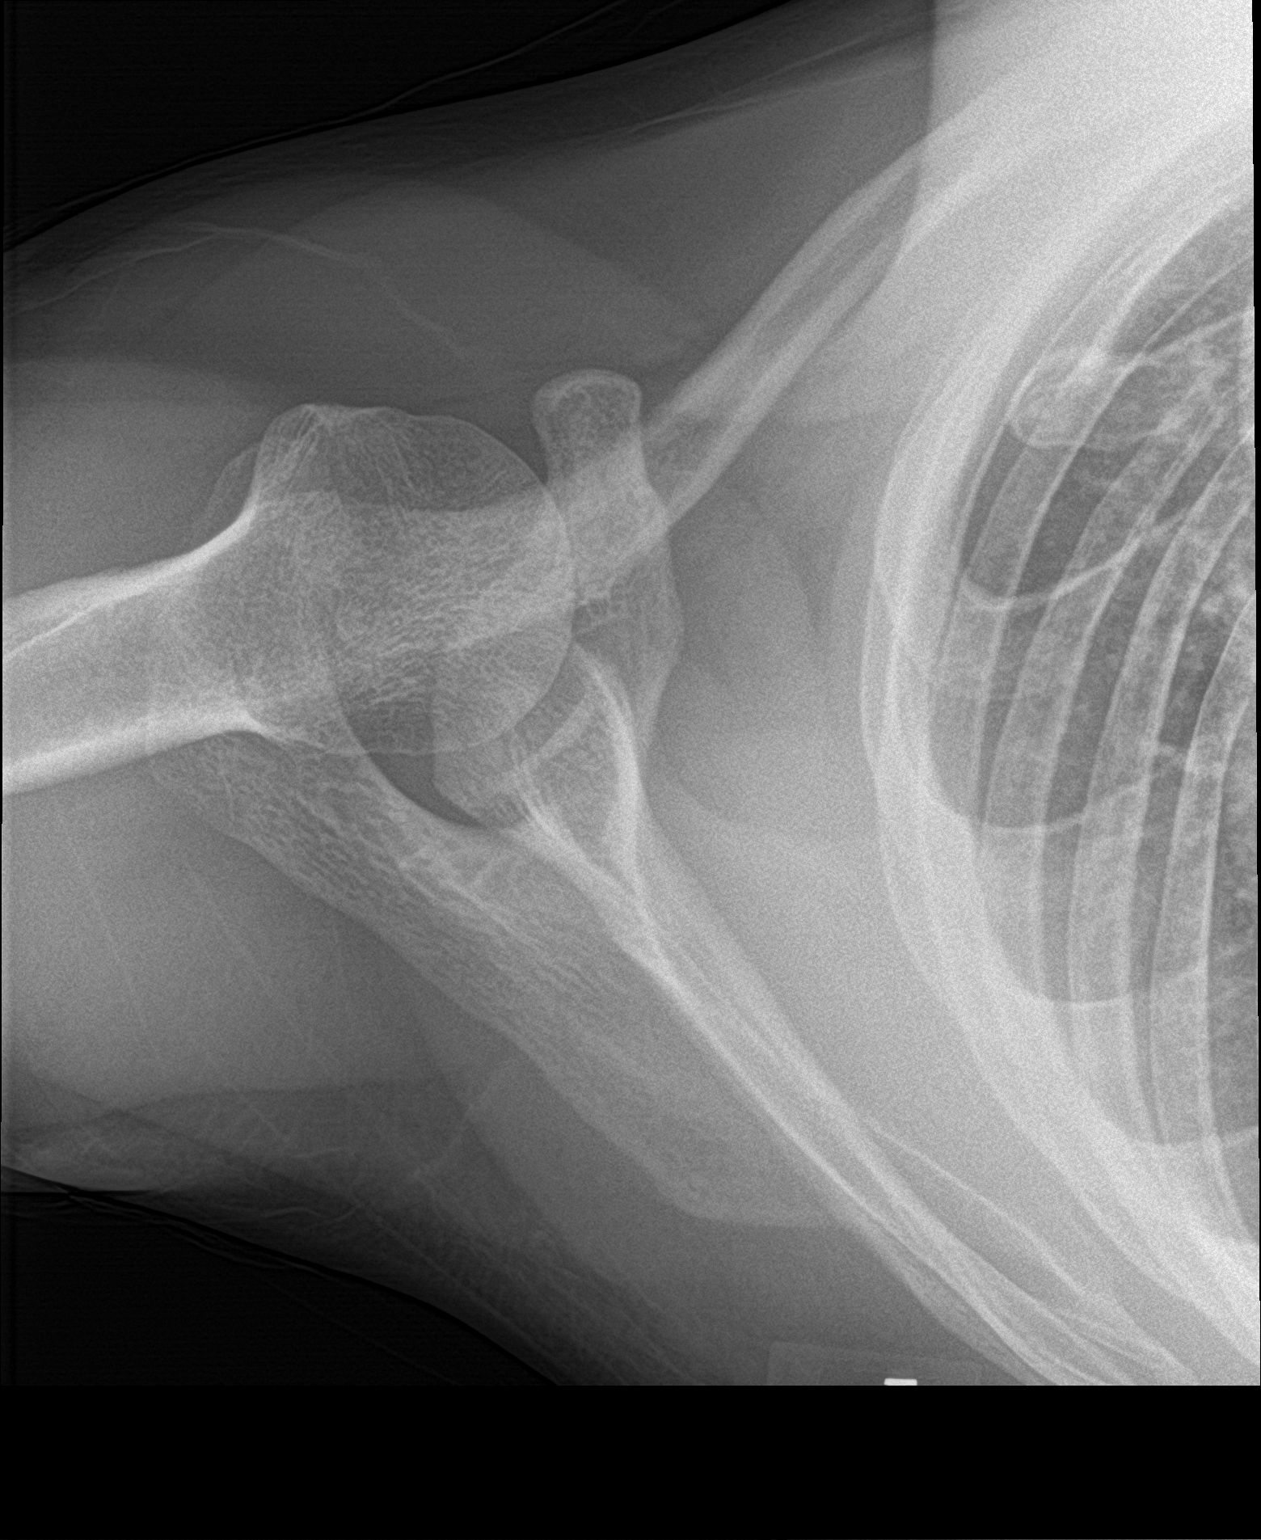

[3 of 3 positions shown; findings below may reference images not displayed]

FINDINGS: There is no evidence of fracture or dislocation. There is no
evidence of arthropathy or other focal bone abnormality. Soft
tissues are unremarkable.
IMPRESSION: Negative.

## 2021-04-23 ENCOUNTER — Ambulatory Visit (INDEPENDENT_AMBULATORY_CARE_PROVIDER_SITE_OTHER): Payer: Self-pay | Admitting: Medical-Surgical

## 2021-04-23 ENCOUNTER — Encounter: Payer: Self-pay | Admitting: Medical-Surgical

## 2021-04-23 ENCOUNTER — Other Ambulatory Visit: Payer: Self-pay

## 2021-04-23 VITALS — BP 136/87 | HR 61 | Resp 20 | Ht 67.0 in | Wt 181.0 lb

## 2021-04-23 DIAGNOSIS — Z7689 Persons encountering health services in other specified circumstances: Secondary | ICD-10-CM

## 2021-04-23 DIAGNOSIS — Z131 Encounter for screening for diabetes mellitus: Secondary | ICD-10-CM

## 2021-04-23 DIAGNOSIS — L74519 Primary focal hyperhidrosis, unspecified: Secondary | ICD-10-CM

## 2021-04-23 DIAGNOSIS — F419 Anxiety disorder, unspecified: Secondary | ICD-10-CM

## 2021-04-23 DIAGNOSIS — Z0000 Encounter for general adult medical examination without abnormal findings: Secondary | ICD-10-CM

## 2021-04-23 DIAGNOSIS — Z1329 Encounter for screening for other suspected endocrine disorder: Secondary | ICD-10-CM

## 2021-04-23 DIAGNOSIS — R5383 Other fatigue: Secondary | ICD-10-CM

## 2021-04-23 MED ORDER — ALUMINUM CHLORIDE 20 % EX SOLN: CUTANEOUS | 3 refills | Status: AC

## 2021-04-23 NOTE — Patient Instructions (Signed)
Herbal options for anxiety/stress: Ashwaghanda Kava kava Valerian root St. John's wort  Melatonin Chamomile    Reasons why it is important to allow yourself to process and experience true feelings: When you numb sadness, you also numb happiness and Arsh Feutz. Struggling with your emotions often leads to more suffering. Processing and experiencing your feelings is a part of having a full life.    Coping skills are things we can do to make ourselves feel better when we are going through difficult times. Examples of coping skills include:  Take a deep breath Count to 20 Listen to music Call a friend Take a walk Read a book Do a puzzle Meditate Journal Exercise Stretch Sing Bake Knit Go outside [employer] Send a note Take a bath Watch a movie Pet an animal Visit a friend Be alone in a quiet place   When your anxiety gets worse, try these grounding techniques:      If you need additional help, please contact your primary care provider or call one of the resources listed below:  [facility]  24-hour HelpLine at (651) 031-0001 or [phone] [address] [place], [place] [address]  [facility] 800-SUICIDE or (628) 750-8773  The National Suicide Prevention Lifeline 800-273-TALK or 952-534-0059  [facility] https://www.celebraterecovery.com/

## 2021-04-23 NOTE — Progress Notes (Signed)
New Patient Office Visit  Subjective:  Patient ID: Ross Wagner, male    DOB: January 13, 1991  Age: 30 y.o. MRN: [id number]  CC:  Chief Complaint  Patient presents with   Establish Care     HPI Ross Wagner presents to establish care.  He is a very pleasant 30 year old male who was previously seen at this practice several years ago.  He is gone several years without preventative health care and would like to have blood work done today to get updated.  Has had several issues in the past couple of years where he feels like he "crashes".  This is usually after a long bike ride (around 15 miles or more), specifically mountain biking.  Notes that he thought he was eating well and staying well-hydrated however about 30 minutes after such strenuous exercise, he would experience this "crash" where he got extremely fatigued, dizzy, and had to lay down.  He would eat something and about 15 to 30 minutes later would feel better.  He has not had an episode like this in a while but wonders if there may be something in his blood work that will explain what is going on.  Has had a worsening of anxiety lately.  Previously had some issues with anxiety however has always been able to manage.  Unfortunately, recent events have been far more difficult to work through.  Notes that his dad ended up in the hospital out of state and then several weeks later, he had to put his dog to sleep.  Has been having chest tightness, mild shortness of breath, dizziness, and even nausea at times.  Has had these feelings in the past but this has been more constant.  Does note that he is able to exercise without chest pain or significant shortness of breath.  Also notes that if he gets very focused on an activity or task, his symptoms do not bother him.  Not currently doing any counseling and has never taken medications for anxiety or depression before.  Does take multiple supplements and reports preferring the natural route if  possible.  Unsure if this is related to his anxiety or not but he has had significant sweating of his feet recently.  After a long day at work, he goes home and his socks are soaked with sweat.  This is more of an annoyance but is more uncomfortable since it started to get cold outside.  Past Medical History:  Diagnosis Date   Asthma     Past Surgical History:  Procedure Laterality Date   DENTAL SURGERY      Family History  Problem Relation Age of Onset   Hypertension Maternal Uncle    Stroke Maternal Grandmother     Social History   Socioeconomic History   Marital status: Married    Spouse name: Not on file   Number of children: Not on file   Years of education: Not on file   Highest education level: Not on file  Occupational History   Not on file  Tobacco Use   Smoking status: Never   Smokeless tobacco: Never  Vaping Use   Vaping Use: Never used  Substance and Sexual Activity   Alcohol use: Never   Drug use: Never   Sexual activity: Yes    Birth control/protection: Condom  Other Topics Concern   Not on file  Social History Narrative   Not on file   Social Determinants of Health   Financial Resource Strain: Not  on file  Food Insecurity: Not on file  Transportation Needs: Not on file  Physical Activity: Not on file  Stress: Not on file  Social Connections: Not on file  Intimate Partner Violence: Not on file    ROS Review of Systems  Constitutional:  Negative for chills, fatigue, fever and unexpected weight change.  HENT:  Negative for congestion, rhinorrhea, sinus pressure and sore throat.   Eyes:  Positive for visual disturbance.  Respiratory:  Positive for chest tightness and shortness of breath. Negative for cough and wheezing.   Cardiovascular:  Negative for chest pain, palpitations and leg swelling.  Gastrointestinal:  Positive for diarrhea (More frequent bowel movements when upset about something or feeling anxious). Negative for abdominal pain,  constipation, nausea and vomiting.  Genitourinary:  Negative for dysuria, frequency and urgency.  Skin:  Negative for rash.  Neurological:  Positive for dizziness. Negative for light-headedness and headaches.  Psychiatric/Behavioral:  Positive for sleep disturbance. Negative for dysphoric mood, self-injury and suicidal ideas. The patient is nervous/anxious.    Objective:   Today's Vitals: BP 136/87 (BP Location: Right Arm, Patient Position: Sitting, Cuff Size: Large)   Pulse 61   Resp 20   Ht 5\' 7"  (1.702 m)   Wt 181 lb (82.1 kg)   SpO2 99%   BMI 28.35 kg/m   Physical Exam Vitals and nursing note reviewed.  Constitutional:      General: He is not in acute distress.    Appearance: Normal appearance. He is not ill-appearing.  HENT:     Head: Normocephalic and atraumatic.  Cardiovascular:     Rate and Rhythm: Normal rate and regular rhythm.     Pulses: Normal pulses.     Heart sounds: Normal heart sounds. No murmur heard.   No friction rub. No gallop.  Pulmonary:     Effort: Pulmonary effort is normal. No respiratory distress.     Breath sounds: Normal breath sounds.  Skin:    General: Skin is warm and dry.  Neurological:     Mental Status: He is alert and oriented to person, place, and time.  Psychiatric:        Mood and Affect: Mood normal.        Behavior: Behavior normal.        Thought Content: Thought content normal.        Judgment: Judgment normal.    Assessment & Plan:   1. Encounter to establish care Reviewed available information and discussed care concerns with patient.   2. Fatigue, unspecified type Checking labs today.  Possibly related to elevations in anxiety - TSH - COMPLETE METABOLIC PANEL WITH GFR - CBC with Differential/Platelet - Hemoglobin A1c  3. Anxiety Checking labs.  Discussed various options for treatment.  Unable to financially afford counseling at this time due to being uninsured.  Discussed the option of medications versus herbal  supplements.  Coping skills provided with AVS along with a list of possible herbal supplements that may help with mood management. - TSH - COMPLETE METABOLIC PANEL WITH GFR - CBC with Differential/Platelet  4. Preventative health care Checking labs. - Lipid panel - COMPLETE METABOLIC PANEL WITH GFR - CBC with Differential/Platelet  5. Diabetes mellitus screening Checking hemoglobin A1c. - Hemoglobin A1c  6. Thyroid disorder screen Checking TSH. - TSH  7. Excessive sweating, local Sending in Drysol to see if this will be helpful with swelling of his feet.  Unclear etiology.  Checking labs.  Outpatient Encounter Medications as of 04/23/2021  Medication Sig   aluminum chloride (DRYSOL) 20 % external solution Apply daily at bedtime for 3 days then weekly to areas of excessive sweat.   [DISCONTINUED] cyclobenzaprine (FLEXERIL) 10 MG tablet Take 1 tablet (10 mg total) by mouth 2 (two) times daily as needed. (Patient not taking: Reported on 04/23/2021)   No facility-administered encounter medications on file as of 04/23/2021.    Follow-up: Return in about 1 year (around 04/23/2022) for annual physical exam.   Thayer Ohm, DNP, APRN, FNP-BC [facility] and Sports Medicine

## 2021-04-24 LAB — CBC WITH DIFFERENTIAL/PLATELET
Absolute Monocytes: 360 cells/uL (ref 200–950)
Basophils Absolute: 50 cells/uL (ref 0–200)
Basophils Relative: 1 %
Eosinophils Absolute: 180 cells/uL (ref 15–500)
Eosinophils Relative: 3.6 %
HCT: 45.1 % (ref 38.5–50.0)
Hemoglobin: 15.4 g/dL (ref 13.2–17.1)
Lymphs Abs: 2070 cells/uL (ref 850–3900)
MCH: 31.6 pg (ref 27.0–33.0)
MCHC: 34.1 g/dL (ref 32.0–36.0)
MCV: 92.4 fL (ref 80.0–100.0)
MPV: 9.9 fL (ref 7.5–12.5)
Monocytes Relative: 7.2 %
Neutro Abs: 2340 cells/uL (ref 1500–7800)
Neutrophils Relative %: 46.8 %
Platelets: 289 10*3/uL (ref 140–400)
RBC: 4.88 10*6/uL (ref 4.20–5.80)
RDW: 11.5 % (ref 11.0–15.0)
Total Lymphocyte: 41.4 %
WBC: 5 10*3/uL (ref 3.8–10.8)

## 2021-04-24 LAB — COMPLETE METABOLIC PANEL WITH GFR
AG Ratio: 1.8 (calc) (ref 1.0–2.5)
ALT: 24 U/L (ref 9–46)
AST: 20 U/L (ref 10–40)
Albumin: 4.6 g/dL (ref 3.6–5.1)
Alkaline phosphatase (APISO): 48 U/L (ref 36–130)
BUN: 12 mg/dL (ref 7–25)
CO2: 31 mmol/L (ref 20–32)
Calcium: 9.9 mg/dL (ref 8.6–10.3)
Chloride: 102 mmol/L (ref 98–110)
Creat: 1.01 mg/dL (ref 0.60–1.26)
Globulin: 2.6 g/dL (calc) (ref 1.9–3.7)
Glucose, Bld: 93 mg/dL (ref 65–139)
Potassium: 4 mmol/L (ref 3.5–5.3)
Sodium: 140 mmol/L (ref 135–146)
Total Bilirubin: 0.8 mg/dL (ref 0.2–1.2)
Total Protein: 7.2 g/dL (ref 6.1–8.1)
eGFR: 101 mL/min/{1.73_m2} (ref >=60)

## 2021-04-24 LAB — LIPID PANEL
Cholesterol: 179 mg/dL (ref <200)
HDL: 55 mg/dL (ref >=40)
LDL Cholesterol (Calc): 108 mg/dL (calc) — ABNORMAL HIGH
Non-HDL Cholesterol (Calc): 124 mg/dL (calc) (ref <130)
Total CHOL/HDL Ratio: 3.3 (calc) (ref <5.0)
Triglycerides: 72 mg/dL (ref <150)

## 2021-04-24 LAB — HEMOGLOBIN A1C
Hgb A1c MFr Bld: 5.5 % of total Hgb (ref <5.7)
Mean Plasma Glucose: 111 mg/dL
eAG (mmol/L): 6.2 mmol/L

## 2021-04-24 LAB — TSH: TSH: 1.23 mIU/L (ref 0.40–4.50)
